# Patient Record
Sex: Male | Born: 1955 | Race: White | Hispanic: No | Smoking: Never smoker
Health system: Southern US, Community
[De-identification: ages and names within clinical notes are randomized; demographics above are authoritative.]

## PROBLEM LIST (undated history)

## (undated) DIAGNOSIS — K3531 Acute appendicitis with localized peritonitis and gangrene, without perforation: Secondary | ICD-10-CM

## (undated) DIAGNOSIS — I10 Essential (primary) hypertension: Secondary | ICD-10-CM

## (undated) HISTORY — DX: Acute appendicitis with localized peritonitis and gangrene, without perforation: K35.31

---

## 2004-03-12 ENCOUNTER — Emergency Department: Payer: Self-pay | Admitting: Internal Medicine

## 2004-04-02 ENCOUNTER — Other Ambulatory Visit: Payer: Self-pay

## 2004-04-02 ENCOUNTER — Emergency Department: Payer: Self-pay | Admitting: Unknown Physician Specialty

## 2007-12-07 ENCOUNTER — Emergency Department: Payer: Self-pay | Admitting: Emergency Medicine

## 2013-04-01 ENCOUNTER — Ambulatory Visit: Payer: Self-pay | Admitting: Internal Medicine

## 2013-04-12 DIAGNOSIS — I10 Essential (primary) hypertension: Secondary | ICD-10-CM | POA: Insufficient documentation

## 2013-09-19 DIAGNOSIS — M109 Gout, unspecified: Secondary | ICD-10-CM | POA: Insufficient documentation

## 2015-06-05 DIAGNOSIS — N529 Male erectile dysfunction, unspecified: Secondary | ICD-10-CM | POA: Insufficient documentation

## 2016-12-23 DIAGNOSIS — J4521 Mild intermittent asthma with (acute) exacerbation: Secondary | ICD-10-CM | POA: Insufficient documentation

## 2017-06-24 ENCOUNTER — Emergency Department: Payer: BC Managed Care – PPO | Admitting: Certified Registered Nurse Anesthetist

## 2017-06-24 ENCOUNTER — Observation Stay
Admission: EM | Admit: 2017-06-24 | Discharge: 2017-06-26 | Disposition: A | Discharge status: Hospice | Payer: BC Managed Care – PPO | Attending: Surgery | Admitting: Surgery

## 2017-06-24 ENCOUNTER — Encounter: Payer: Self-pay | Admitting: Emergency Medicine

## 2017-06-24 ENCOUNTER — Encounter
Admission: EM | Disposition: A | Discharge status: Hospice | Payer: Self-pay | Source: Home / Self Care | Attending: Emergency Medicine

## 2017-06-24 ENCOUNTER — Other Ambulatory Visit: Payer: Self-pay

## 2017-06-24 ENCOUNTER — Emergency Department: Payer: BC Managed Care – PPO

## 2017-06-24 DIAGNOSIS — Z79899 Other long term (current) drug therapy: Secondary | ICD-10-CM | POA: Diagnosis not present

## 2017-06-24 DIAGNOSIS — K3531 Acute appendicitis with localized peritonitis and gangrene, without perforation: Principal | ICD-10-CM | POA: Insufficient documentation

## 2017-06-24 DIAGNOSIS — K3532 Acute appendicitis with perforation and localized peritonitis, without abscess: Secondary | ICD-10-CM | POA: Diagnosis not present

## 2017-06-24 DIAGNOSIS — I10 Essential (primary) hypertension: Secondary | ICD-10-CM | POA: Diagnosis not present

## 2017-06-24 DIAGNOSIS — K3580 Unspecified acute appendicitis: Secondary | ICD-10-CM | POA: Diagnosis present

## 2017-06-24 DIAGNOSIS — K219 Gastro-esophageal reflux disease without esophagitis: Secondary | ICD-10-CM | POA: Diagnosis not present

## 2017-06-24 DIAGNOSIS — A419 Sepsis, unspecified organism: Secondary | ICD-10-CM

## 2017-06-24 HISTORY — DX: Essential (primary) hypertension: I10

## 2017-06-24 HISTORY — DX: Acute appendicitis with localized peritonitis and gangrene, without perforation: K35.31

## 2017-06-24 HISTORY — PX: LAPAROSCOPIC APPENDECTOMY: SHX408

## 2017-06-24 LAB — URINALYSIS, COMPLETE (UACMP) WITH MICROSCOPIC
BACTERIA UA: NONE SEEN
BILIRUBIN URINE: NEGATIVE
GLUCOSE, UA: NEGATIVE mg/dL
HGB URINE DIPSTICK: NEGATIVE
KETONES UR: 5 mg/dL — AB
LEUKOCYTES UA: NEGATIVE
NITRITE: NEGATIVE
PROTEIN: NEGATIVE mg/dL
Specific Gravity, Urine: 1.024 (ref 1.005–1.030)
pH: 5 (ref 5.0–8.0)

## 2017-06-24 LAB — CBC
HEMATOCRIT: 44.6 % (ref 40.0–52.0)
HEMOGLOBIN: 15 g/dL (ref 13.0–18.0)
MCH: 28.1 pg (ref 26.0–34.0)
MCHC: 33.7 g/dL (ref 32.0–36.0)
MCV: 83.4 fL (ref 80.0–100.0)
Platelets: 302 10*3/uL (ref 150–440)
RBC: 5.34 MIL/uL (ref 4.40–5.90)
RDW: 14.7 % — AB (ref 11.5–14.5)
WBC: 25.6 10*3/uL — AB (ref 3.8–10.6)

## 2017-06-24 LAB — COMPREHENSIVE METABOLIC PANEL
ALBUMIN: 4.5 g/dL (ref 3.5–5.0)
ALT: 20 U/L (ref 17–63)
ANION GAP: 9 (ref 5–15)
AST: 25 U/L (ref 15–41)
Alkaline Phosphatase: 83 U/L (ref 38–126)
BUN: 10 mg/dL (ref 6–20)
CHLORIDE: 101 mmol/L (ref 101–111)
CO2: 23 mmol/L (ref 22–32)
Calcium: 9.9 mg/dL (ref 8.9–10.3)
Creatinine, Ser: 0.88 mg/dL (ref 0.61–1.24)
GFR calc Af Amer: >60 mL/min (ref >60)
GFR calc non Af Amer: >60 mL/min (ref >60)
GLUCOSE: 161 mg/dL — AB (ref 65–99)
POTASSIUM: 4.4 mmol/L (ref 3.5–5.1)
Sodium: 133 mmol/L — ABNORMAL LOW (ref 135–145)
TOTAL PROTEIN: 8.6 g/dL — AB (ref 6.5–8.1)
Total Bilirubin: 2 mg/dL — ABNORMAL HIGH (ref 0.3–1.2)

## 2017-06-24 LAB — LACTIC ACID, PLASMA
LACTIC ACID, VENOUS: 1.4 mmol/L (ref 0.5–1.9)
Lactic Acid, Venous: 1.3 mmol/L (ref 0.5–1.9)

## 2017-06-24 LAB — LIPASE, BLOOD: LIPASE: 24 U/L (ref 11–51)

## 2017-06-24 SURGERY — APPENDECTOMY, LAPAROSCOPIC
Anesthesia: General | Site: Abdomen | Wound class: Clean Contaminated

## 2017-06-24 MED ORDER — ENOXAPARIN SODIUM 40 MG/0.4ML ~~LOC~~ SOLN
40.0000 mg | SUBCUTANEOUS | Status: DC
  Administered 2017-06-25: 40 mg via SUBCUTANEOUS
  Filled 2017-06-24 (×2): qty 0.4

## 2017-06-24 MED ORDER — ESMOLOL HCL 100 MG/10ML IV SOLN
INTRAVENOUS | Status: DC | PRN
  Administered 2017-06-24: 20 mg via INTRAVENOUS

## 2017-06-24 MED ORDER — ROCURONIUM BROMIDE 100 MG/10ML IV SOLN
INTRAVENOUS | Status: DC | PRN
  Administered 2017-06-24: 10 mg via INTRAVENOUS
  Administered 2017-06-24: 5 mg via INTRAVENOUS
  Administered 2017-06-24: 40 mg via INTRAVENOUS

## 2017-06-24 MED ORDER — BUDESONIDE 0.25 MG/2ML IN SUSP
0.2500 mg | Freq: Two times a day (BID) | RESPIRATORY_TRACT | Status: DC
  Administered 2017-06-24 – 2017-06-26 (×4): 0.25 mg via RESPIRATORY_TRACT
  Filled 2017-06-24 (×4): qty 2

## 2017-06-24 MED ORDER — ONDANSETRON HCL 4 MG/2ML IJ SOLN: 4.0000 mg | Freq: Once | INTRAMUSCULAR | Status: DC | PRN

## 2017-06-24 MED ORDER — ONDANSETRON HCL 4 MG/2ML IJ SOLN
4.0000 mg | Freq: Once | INTRAMUSCULAR | Status: DC
  Filled 2017-06-24: qty 2

## 2017-06-24 MED ORDER — CHLORHEXIDINE GLUCONATE CLOTH 2 % EX PADS: 6.0000 | MEDICATED_PAD | Freq: Once | CUTANEOUS | Status: DC

## 2017-06-24 MED ORDER — PIPERACILLIN-TAZOBACTAM 3.375 G IVPB 30 MIN
3.3750 g | Freq: Once | INTRAVENOUS | Status: AC
  Administered 2017-06-24: 3.375 g via INTRAVENOUS
  Filled 2017-06-24: qty 50

## 2017-06-24 MED ORDER — SUGAMMADEX SODIUM 200 MG/2ML IV SOLN
INTRAVENOUS | Status: DC | PRN
  Administered 2017-06-24: 200 mg via INTRAVENOUS

## 2017-06-24 MED ORDER — ACETAMINOPHEN 10 MG/ML IV SOLN
INTRAVENOUS | Status: AC
Start: 2017-06-24 — End: ?
  Filled 2017-06-24: qty 100

## 2017-06-24 MED ORDER — FENTANYL CITRATE (PF) 100 MCG/2ML IJ SOLN
INTRAMUSCULAR | Status: AC
  Filled 2017-06-24: qty 2

## 2017-06-24 MED ORDER — IOPAMIDOL (ISOVUE-370) INJECTION 76%
75.0000 mL | Freq: Once | INTRAVENOUS | Status: AC | PRN
  Administered 2017-06-24: 75 mL via INTRAVENOUS

## 2017-06-24 MED ORDER — FENTANYL CITRATE (PF) 100 MCG/2ML IJ SOLN
INTRAMUSCULAR | Status: DC | PRN
  Administered 2017-06-24 (×3): 50 ug via INTRAVENOUS

## 2017-06-24 MED ORDER — FENTANYL CITRATE (PF) 100 MCG/2ML IJ SOLN
INTRAMUSCULAR | Status: AC
  Administered 2017-06-24: 25 ug via INTRAVENOUS
  Filled 2017-06-24: qty 2

## 2017-06-24 MED ORDER — ONDANSETRON HCL 4 MG/2ML IJ SOLN
INTRAMUSCULAR | Status: DC | PRN
Start: 2017-06-24 — End: 2017-06-24
  Administered 2017-06-24: 4 mg via INTRAVENOUS

## 2017-06-24 MED ORDER — MORPHINE SULFATE (PF) 2 MG/ML IV SOLN
2.0000 mg | INTRAVENOUS | Status: DC | PRN
  Administered 2017-06-24: 2 mg via INTRAVENOUS
  Filled 2017-06-24: qty 1

## 2017-06-24 MED ORDER — SUCCINYLCHOLINE CHLORIDE 20 MG/ML IJ SOLN
INTRAMUSCULAR | Status: DC | PRN
Start: 2017-06-24 — End: 2017-06-24
  Administered 2017-06-24: 100 mg via INTRAVENOUS

## 2017-06-24 MED ORDER — IPRATROPIUM-ALBUTEROL 0.5-2.5 (3) MG/3ML IN SOLN
3.0000 mL | Freq: Four times a day (QID) | RESPIRATORY_TRACT | Status: DC | PRN
  Administered 2017-06-26: 3 mL via RESPIRATORY_TRACT
  Filled 2017-06-24: qty 3

## 2017-06-24 MED ORDER — ACETAMINOPHEN 10 MG/ML IV SOLN
INTRAVENOUS | Status: DC | PRN
Start: 2017-06-24 — End: 2017-06-24
  Administered 2017-06-24: 1000 mg via INTRAVENOUS

## 2017-06-24 MED ORDER — ACETAMINOPHEN 650 MG RE SUPP: 650.0000 mg | Freq: Four times a day (QID) | RECTAL | Status: DC | PRN

## 2017-06-24 MED ORDER — SUGAMMADEX SODIUM 200 MG/2ML IV SOLN
INTRAVENOUS | Status: AC
  Filled 2017-06-24: qty 2

## 2017-06-24 MED ORDER — DEXAMETHASONE SODIUM PHOSPHATE 10 MG/ML IJ SOLN
INTRAMUSCULAR | Status: AC
Start: 2017-06-24 — End: 2017-06-24
  Filled 2017-06-24: qty 1

## 2017-06-24 MED ORDER — SODIUM CHLORIDE 0.9 % IV BOLUS
1000.0000 mL | Freq: Once | INTRAVENOUS | Status: AC
  Administered 2017-06-24: 1000 mL via INTRAVENOUS

## 2017-06-24 MED ORDER — PIPERACILLIN-TAZOBACTAM 3.375 G IVPB
3.3750 g | Freq: Three times a day (TID) | INTRAVENOUS | Status: DC
  Administered 2017-06-24 – 2017-06-26 (×5): 3.375 g via INTRAVENOUS
  Filled 2017-06-24 (×5): qty 50

## 2017-06-24 MED ORDER — LACTATED RINGERS IV SOLN
INTRAVENOUS | Status: DC | PRN
  Administered 2017-06-24: 18:00:00 via INTRAVENOUS

## 2017-06-24 MED ORDER — LIDOCAINE HCL (CARDIAC) PF 100 MG/5ML IV SOSY
PREFILLED_SYRINGE | INTRAVENOUS | Status: DC | PRN
  Administered 2017-06-24: 100 mg via INTRAVENOUS

## 2017-06-24 MED ORDER — ONDANSETRON HCL 4 MG/2ML IJ SOLN
INTRAMUSCULAR | Status: AC
  Filled 2017-06-24: qty 2

## 2017-06-24 MED ORDER — SUCCINYLCHOLINE CHLORIDE 20 MG/ML IJ SOLN
INTRAMUSCULAR | Status: AC
  Filled 2017-06-24: qty 1

## 2017-06-24 MED ORDER — LIDOCAINE HCL 1 % IJ SOLN
INTRAMUSCULAR | Status: DC | PRN
  Administered 2017-06-24: 20 mL via SUBCUTANEOUS

## 2017-06-24 MED ORDER — ACETAMINOPHEN 325 MG PO TABS: 650.0000 mg | ORAL_TABLET | Freq: Four times a day (QID) | ORAL | Status: DC | PRN

## 2017-06-24 MED ORDER — GABAPENTIN 300 MG PO CAPS: 300.0000 mg | ORAL_CAPSULE | ORAL | Status: DC

## 2017-06-24 MED ORDER — IOPAMIDOL (ISOVUE-300) INJECTION 61%
30.0000 mL | Freq: Once | INTRAVENOUS | Status: AC | PRN
  Administered 2017-06-24: 30 mL via ORAL

## 2017-06-24 MED ORDER — LISINOPRIL 20 MG PO TABS
20.0000 mg | ORAL_TABLET | Freq: Every day | ORAL | Status: DC
  Administered 2017-06-25 – 2017-06-26 (×2): 20 mg via ORAL
  Filled 2017-06-24 (×2): qty 1

## 2017-06-24 MED ORDER — ONDANSETRON HCL 4 MG/2ML IJ SOLN: 4.0000 mg | Freq: Four times a day (QID) | INTRAMUSCULAR | Status: DC | PRN

## 2017-06-24 MED ORDER — FENTANYL CITRATE (PF) 100 MCG/2ML IJ SOLN
25.0000 ug | INTRAMUSCULAR | Status: DC | PRN
  Administered 2017-06-24 (×4): 25 ug via INTRAVENOUS

## 2017-06-24 MED ORDER — KETOROLAC TROMETHAMINE 30 MG/ML IJ SOLN
30.0000 mg | Freq: Four times a day (QID) | INTRAMUSCULAR | Status: DC
  Administered 2017-06-24 – 2017-06-26 (×7): 30 mg via INTRAVENOUS
  Filled 2017-06-24 (×7): qty 1

## 2017-06-24 MED ORDER — ROCURONIUM BROMIDE 50 MG/5ML IV SOLN
INTRAVENOUS | Status: AC
Start: 2017-06-24 — End: 2017-06-24
  Filled 2017-06-24: qty 1

## 2017-06-24 MED ORDER — PROPOFOL 10 MG/ML IV BOLUS
INTRAVENOUS | Status: DC | PRN
  Administered 2017-06-24: 160 mg via INTRAVENOUS

## 2017-06-24 MED ORDER — PIPERACILLIN-TAZOBACTAM 3.375 G IVPB: 3.3750 g | Freq: Three times a day (TID) | INTRAVENOUS | Status: DC

## 2017-06-24 MED ORDER — LIDOCAINE HCL (PF) 2 % IJ SOLN
INTRAMUSCULAR | Status: AC
  Filled 2017-06-24: qty 10

## 2017-06-24 MED ORDER — OXYCODONE-ACETAMINOPHEN 5-325 MG PO TABS: 1.0000 | ORAL_TABLET | ORAL | Status: DC | PRN

## 2017-06-24 MED ORDER — DEXAMETHASONE SODIUM PHOSPHATE 10 MG/ML IJ SOLN
INTRAMUSCULAR | Status: DC | PRN
  Administered 2017-06-24: 10 mg via INTRAVENOUS

## 2017-06-24 MED ORDER — AMLODIPINE BESYLATE 5 MG PO TABS
5.0000 mg | ORAL_TABLET | Freq: Every day | ORAL | Status: DC
  Administered 2017-06-25 – 2017-06-26 (×2): 5 mg via ORAL
  Filled 2017-06-24 (×2): qty 1

## 2017-06-24 MED ORDER — PROPOFOL 10 MG/ML IV BOLUS
INTRAVENOUS | Status: AC
  Filled 2017-06-24: qty 40

## 2017-06-24 MED ORDER — MORPHINE SULFATE (PF) 4 MG/ML IV SOLN
4.0000 mg | Freq: Once | INTRAVENOUS | Status: DC
  Filled 2017-06-24: qty 1

## 2017-06-24 MED ORDER — ONDANSETRON 4 MG PO TBDP: 4.0000 mg | ORAL_TABLET | Freq: Four times a day (QID) | ORAL | Status: DC | PRN

## 2017-06-24 MED ORDER — KCL IN DEXTROSE-NACL 20-5-0.45 MEQ/L-%-% IV SOLN
INTRAVENOUS | Status: DC
  Administered 2017-06-24 – 2017-06-25 (×2): via INTRAVENOUS
  Filled 2017-06-24 (×4): qty 1000

## 2017-06-24 MED ORDER — ALBUTEROL SULFATE (2.5 MG/3ML) 0.083% IN NEBU
2.5000 mg | INHALATION_SOLUTION | RESPIRATORY_TRACT | Status: DC | PRN
  Administered 2017-06-24 – 2017-06-25 (×2): 2.5 mg via RESPIRATORY_TRACT
  Filled 2017-06-24 (×2): qty 3

## 2017-06-24 SURGICAL SUPPLY — 38 items
BLADE SURG SZ11 CARB STEEL (BLADE) ×3 IMPLANT
CANISTER SUCT 3000ML PPV (MISCELLANEOUS) ×3 IMPLANT
CHLORAPREP W/TINT 26ML (MISCELLANEOUS) ×3 IMPLANT
CUTTER FLEX LINEAR 45M (STAPLE) ×3 IMPLANT
DECANTER SPIKE VIAL GLASS SM (MISCELLANEOUS) ×3 IMPLANT
DERMABOND ADVANCED (GAUZE/BANDAGES/DRESSINGS) ×2
DERMABOND ADVANCED .7 DNX12 (GAUZE/BANDAGES/DRESSINGS) ×1 IMPLANT
ELECT REM PT RETURN 9FT ADLT (ELECTROSURGICAL) ×3
ELECTRODE REM PT RTRN 9FT ADLT (ELECTROSURGICAL) ×1 IMPLANT
GLOVE BIO SURGEON STRL SZ7 (GLOVE) ×3 IMPLANT
GLOVE BIOGEL PI IND STRL 7.5 (GLOVE) ×1 IMPLANT
GLOVE BIOGEL PI INDICATOR 7.5 (GLOVE) ×2
GOWN STRL REUS W/ TWL LRG LVL4 (GOWN DISPOSABLE) ×1 IMPLANT
GOWN STRL REUS W/ TWL XL LVL3 (GOWN DISPOSABLE) ×1 IMPLANT
GOWN STRL REUS W/TWL LRG LVL4 (GOWN DISPOSABLE) ×2
GOWN STRL REUS W/TWL XL LVL3 (GOWN DISPOSABLE) ×2
GRASPER SUT TROCAR 14GX15 (MISCELLANEOUS) ×3 IMPLANT
IRRIGATION STRYKERFLOW (MISCELLANEOUS) IMPLANT
IRRIGATOR STRYKERFLOW (MISCELLANEOUS)
KIT TURNOVER KIT A (KITS) ×3 IMPLANT
NEEDLE HYPO 22GX1.5 SAFETY (NEEDLE) ×3 IMPLANT
NEEDLE INSUFFLATION 14GA 120MM (NEEDLE) ×3 IMPLANT
NS IRRIG 1000ML POUR BTL (IV SOLUTION) ×3 IMPLANT
PACK LAP CHOLECYSTECTOMY (MISCELLANEOUS) ×3 IMPLANT
POUCH SPECIMEN RETRIEVAL 10MM (ENDOMECHANICALS) ×3 IMPLANT
RELOAD 45 VASCULAR/THIN (ENDOMECHANICALS) IMPLANT
RELOAD STAPLE TA45 3.5 REG BLU (ENDOMECHANICALS) ×3 IMPLANT
SHEARS HARMONIC ACE PLUS 36CM (ENDOMECHANICALS) ×3 IMPLANT
SLEEVE ENDOPATH XCEL 5M (ENDOMECHANICALS) ×3 IMPLANT
SOL .9 NS 3000ML IRR  AL (IV SOLUTION)
SOL .9 NS 3000ML IRR UROMATIC (IV SOLUTION) IMPLANT
SUT MNCRL AB 4-0 PS2 18 (SUTURE) ×3 IMPLANT
SUT VICRYL 0 UR6 27IN ABS (SUTURE) ×3 IMPLANT
SUT VICRYL AB 3-0 FS1 BRD 27IN (SUTURE) ×3 IMPLANT
TRAY FOLEY W/METER SILVER 16FR (SET/KITS/TRAYS/PACK) ×3 IMPLANT
TROCAR XCEL 12X100 BLDLESS (ENDOMECHANICALS) ×3 IMPLANT
TROCAR XCEL NON-BLD 5MMX100MML (ENDOMECHANICALS) ×3 IMPLANT
TUBING INSUFFLATION (TUBING) ×3 IMPLANT

## 2017-06-24 NOTE — ED Triage Notes (Signed)
Pt comes into the ED via POV c/o abdominal pain.  Denies any N/V/D.  Patient states he took two zantac today and it eased the pain a little, but the pain is still there.  The pain is offset by foods.  Patient in NAd at this time with even and unlabored respirations. Denies any chest pain, blood in stools, shortness of breath, or dizziness.

## 2017-06-24 NOTE — ED Provider Notes (Signed)
[facility] Emergency Department Provider Note ____________________________________________   First MD Initiated Contact with Patient 06/24/17 1537     (approximate)  I have reviewed the triage vital signs and the nursing notes.   HISTORY  Chief Complaint Abdominal Pain   HPI Albert Simpson is a 62 y.o. male with previous history of hypertension and "stomach sensitivities" which she reports last occurred about 15 years ago.  Patient reports that this morning when he got about 6 AM experiencing pain in his mid abdomen that seems to feel like a 5 out of 10 hard to describe pain in his lower abdomen.  Is been bothersome.  He took 2 acid reflux pills and 2 ibuprofen without relief.  Currently reports 5 out of 10 pain.  Denies nausea or vomiting.  Currently does not wish for any pain medication, drove himself here.  Denies fevers and chills.  No pain or burning with urination.  No chest pain or trouble breathing.  2-3 small bowel movements today.  No black or bloody stool.    Past Medical History:  Diagnosis Date  . Hypertension     There are no active problems to display for this patient.   History reviewed. No pertinent surgical history.  Prior to Admission medications   Not on File  No previous allergies.  Reports history of hypertension  Allergies Patient has no known allergies.  No family history on file.  Social History Social History   Tobacco Use  . Smoking status: Never Smoker  . Smokeless tobacco: Never Used  Substance Use Topics  . Alcohol use: Yes  . Drug use: Never    Review of Systems Constitutional: No fever/chills Eyes: No visual changes. ENT: No sore throat. Cardiovascular: Denies chest pain. Respiratory: Denies shortness of breath. Gastrointestinal: No nausea, no vomiting.  No diarrhea.  No constipation. Genitourinary: Negative for dysuria. Musculoskeletal: Negative for back pain. Skin: Negative for  rash. Neurological: Negative for headaches, focal weakness or numbness.    ____________________________________________   PHYSICAL EXAM:  VITAL SIGNS: ED Triage Vitals  Enc Vitals Group     BP 06/24/17 1329 125/83     Pulse Rate 06/24/17 1329 (!) 126     Resp 06/24/17 1329 18     Temp 06/24/17 1329 99.8 F (37.7 C)     Temp Source 06/24/17 1329 Oral     SpO2 06/24/17 1329 93 %     Weight 06/24/17 1329 160 lb (72.6 kg)     Height 06/24/17 1329 5\' 4"  (1.626 m)     Head Circumference --      Peak Flow --      Pain Score 06/24/17 1337 4     Pain Loc --      Pain Edu? --      Excl. in GC? --     Constitutional: Alert and oriented. Well appearing and in no acute distress. Eyes: Conjunctivae are normal. Head: Atraumatic. Nose: No congestion/rhinnorhea. Mouth/Throat: Mucous membranes are moist. Neck: No stridor.   Cardiovascular: Normal rate, regular rhythm. Grossly normal heart sounds.  Good peripheral circulation. Respiratory: Normal respiratory effort.  No retractions. Lungs CTAB. Gastrointestinal: Soft and mild tenderness throughout both lower quadrants, this was increased over the left lower quadrant and left flank compared to the right.  No peritonitis.  No rebound or guarding.  Negative for Murphy sign. Musculoskeletal: No lower extremity tenderness nor edema. Neurologic:  Normal speech and language. No gross focal neurologic deficits are appreciated.  Skin:  Skin is warm, dry and intact. No rash noted. Psychiatric: Mood and affect are normal. Speech and behavior are normal.  ____________________________________________   LABS (all labs ordered are listed, but only abnormal results are displayed)  Labs Reviewed  COMPREHENSIVE METABOLIC PANEL - Abnormal; Notable for the following components:      Result Value   Sodium 133 (*)    Glucose, Bld 161 (*)    Total Protein 8.6 (*)    Total Bilirubin 2.0 (*)    All other components within normal limits  CBC - Abnormal;  Notable for the following components:   WBC 25.6 (*)    RDW 14.7 (*)    All other components within normal limits  URINALYSIS, COMPLETE (UACMP) WITH MICROSCOPIC - Abnormal; Notable for the following components:   Color, Urine YELLOW (*)    APPearance CLEAR (*)    Ketones, ur 5 (*)    All other components within normal limits  CULTURE, BLOOD (ROUTINE X 2)  CULTURE, BLOOD (ROUTINE X 2)  LIPASE, BLOOD  LACTIC ACID, PLASMA  LACTIC ACID, PLASMA   ____________________________________________  EKG   ____________________________________________  RADIOLOGY  CT scan reviewed, notable for acute appendicitis per radiologist    ____________________________________________   PROCEDURES  Procedure(s) performed: None  Procedures  Critical Care performed: No  ____________________________________________   INITIAL IMPRESSION / ASSESSMENT AND PLAN / ED COURSE  Pertinent labs & imaging results that were available during my care of the patient were reviewed by me and considered in my medical decision making (see chart for details).  Differential diagnosis includes but is not limited to, abdominal perforation, aortic dissection, cholecystitis, appendicitis, diverticulitis, colitis, esophagitis/gastritis, kidney stone, pyelonephritis, urinary tract infection, aortic aneurysm. All are considered in decision and treatment plan. Based upon the patient's presentation and risk factors, will proceed with CT scan of the abdomen pelvis to evaluate for etiology of abdominal pain especially given the associated elevated white count.  ----------------------------------------- 5:05 PM on 06/24/2017 -----------------------------------------  CT scan reviewed, concerning for acute appendicitis.  Discussed with Dr. [name] of general surgery.  This point given the patient's leukocytosis and persistent tachycardia we will cover with Zosyn in the event of potential bacteremia or sepsis.  In addition  patient understanding of need for surgery consult, anticipate admission to general surgery service.  Patient awake and alert, does request he would like some pain medication now given he will not be discharged.       ____________________________________________   FINAL CLINICAL IMPRESSION(S) / ED DIAGNOSES  Final diagnoses:  Acute appendicitis, unspecified acute appendicitis type  Sepsis, due to unspecified organism Navos)      NEW MEDICATIONS STARTED DURING THIS VISIT:  New Prescriptions   No medications on file     Note:  This document was prepared using Dragon voice recognition software and may include unintentional dictation errors.     Sharyn Creamer, MD 06/24/17 [phone]

## 2017-06-24 NOTE — Op Note (Signed)
SURGICAL OPERATIVE REPORT  DATE OF PROCEDURE: 06/24/2017  ATTENDING Surgeon(s): Ancil Linseyavis, Jason Evan, MD  ANESTHESIA: GETA (General)  PRE-OPERATIVE DIAGNOSIS: Acute gangrenous non-perforated appendicitis with localized peritonitis (K35.31)  POST-OPERATIVE DIAGNOSIS: Acute gangrenous non-perforated appendicitis with localized peritonitis (K35.31)  PROCEDURE(S):  1.) Laparoscopic appendectomy (cpt: 44970)  INTRAOPERATIVE FINDINGS: Severely inflamed, exceptionally dilated, and gangrenous non-perforated appendix not surrounded by any ascites  INTRAVENOUS FLUIDS: 700 mL crystalloid   ESTIMATED BLOOD LOSS: Minimal (<20 mL)  SPECIMENS: Appendix  IMPLANTS: None  DRAINS: None  COMPLICATIONS: None apparent  CONDITION AT END OF PROCEDURE: Hemodynamically stable and extubated  DISPOSITION OF PATIENT: PACU  INDICATIONS FOR PROCEDURE:  Patient is a 62 y.o. male presented with acute onset of epigastric abdominal pain that progressed to become greatest at patient's lower abdomen. Patient denied any nausea, vomiting, change in bowel habits, fever/chills, CP, or SOB and reported the pain was controlled in the Emergency Department. All risks, benefits, and alternatives to appendectomy were discussed with the patient, all of patient's questions were answered to his expressed satisfaction, and informed consent was obtained and documented.  DETAILS OF PROCEDURE: Patient was brought to the operating suite and appropriately identified. General anesthesia was administered along with confirmation of appropriate pre-operative antibiotics, and endotracheal intubation was performed by anesthetist, along with NG/OG tube for gastric decompression. In supine position, operative site was prepped and draped in usual sterile fashion, and following a brief time out, initial 5 mm incision was made in a natural skin crease just below the umbilicus. Fascia was then elevated, and a Verress needle was inserted and its  proper position confirmed using saline meniscus test prior to abdominal insufflation.  Upon insufflation of the abdominal cavity with carbon dioxide to a well-tolerated pressure of 12-15 mmHg, a 5 mm peri-umbilical port followed by laparoscope were inserted and used to inspect the abdominal cavity and its contents with no injuries from insertion of the first trochar noted. Two additional trocars were inserted, a 12 mm port at the Left lower quadrant position and another 5 mm port at the suprapubic position. The table was then placed in Trendelenburg position with the Right side up, and blunt graspers were gently used to retract the bowel overlying a severely inflamed, rigid, dilated, and gangrenous appendix with focally severe surrounding peri-appendiceal inflammation without any appreciable ascites. The appendix was gently retracted at its middle (essentially resting on the open blunt graspers to avoid perforating the appendix), and the base of the appendix and mesoappendix were identified in relation to the cecum. The mesoappendix was dissected from the visceral appendix, and hemostasis was achieved using a harmonic scalpel. Upon freeing the visceral appendix from the mesoappendix, an endostapler loaded with a standard blue tissue load was advanced across the comparatively normal-appearing base of the visceral appendix, which was compressed for several seconds, and the stapler was deployed and removed from the abdominal cavity. Hemostasis was confirmed, and the specimen was extracted from the abdominal cavity in a laparoscopic specimen bag.  The intraperitoneal cavity was inspected with no additional findings. PMI laparoscopic fascial closure device was then used to re-approximate fascia at the 12 mm Left lower quadrant port site. All ports were then removed under direct visualization, and the abdominal cavity was desuflated. All port sites were irrigated/cleaned, additional local anesthetic was injected at  each incision, 3-0 Vicryl was used to re-approximate dermis at 12 mm port site(s), and subcuticular 4-0 Monocryl suture staples was used to re-approximate skin. Skin was then cleaned, dried, and sterile skin glue  was applied. Patient was then safely able to be awakened, extubated, and transferred to PACU for post-operative monitoring and care.   I was present for all aspects of the above procedure, and no operative complications were apparent.

## 2017-06-24 NOTE — Consult Note (Signed)
SURGICAL CONSULTATION NOTE (initial) - cpt: [id number]  HISTORY OF PRESENT ILLNESS (HPI):  62 y.o. male presented to [facility] ED today for evaluation of abdominal pain. Patient reports he felt well when he went to sleep last night, but this morning began to develop generalized vague abdominal pain that has since become focused at his lower abdomen. He otherwise denies any fever/chills, diarrhea, N/G, CP, or SOB. Though he is 62 years old and sees an NP every 6 months, he has not had a colonoscopy. He denies any history of constipation, blood per rectum, or recent unintentional weight loss/gain.  Surgery is consulted by ED physician Dr. Fanny Bien in this context for evaluation and management of acute non-perforated appendicitis.  PAST MEDICAL HISTORY (PMH):  Past Medical History:  Diagnosis Date  . Hypertension      PAST SURGICAL HISTORY (PSH):  History reviewed. No pertinent surgical history.   MEDICATIONS:  Prior to Admission medications   Medication Sig Start Date End Date Taking? Authorizing Provider  albuterol (PROVENTIL HFA;VENTOLIN HFA) 108 (90 Base) MCG/ACT inhaler Inhale 2 puffs into the lungs every 4 (four) hours as needed for wheezing or shortness of breath.  05/31/17  Yes [provider]  amLODipine (NORVASC) 5 MG tablet Take 5 mg by mouth daily. 05/29/17  Yes [provider]  ASMANEX 120 METERED DOSES 220 MCG/INH inhaler Inhale 1 puff into the lungs daily. 06/03/17  Yes [provider]  COMBIVENT RESPIMAT 20-100 MCG/ACT AERS respimat Inhale 1 puff into the lungs 4 (four) times daily as needed for wheezing or shortness of breath.  05/30/17  Yes [provider]  lisinopril (PRINIVIL,ZESTRIL) 20 MG tablet Take 20 mg by mouth daily. 05/29/17  Yes [provider]     ALLERGIES:  No Known Allergies   SOCIAL HISTORY:  Social History   Socioeconomic History  . Marital status: Divorced    Spouse name: Not on file  . Number of children: Not on file  .  Years of education: Not on file  . Highest education level: Not on file  Occupational History  . Not on file  Social Needs  . Financial resource strain: Not on file  . Food insecurity:    Worry: Not on file    Inability: Not on file  . Transportation needs:    Medical: Not on file    Non-medical: Not on file  Tobacco Use  . Smoking status: Never Smoker  . Smokeless tobacco: Never Used  Substance and Sexual Activity  . Alcohol use: Yes  . Drug use: Never  . Sexual activity: Not on file  Lifestyle  . Physical activity:    Days per week: Not on file    Minutes per session: Not on file  . Stress: Not on file  Relationships  . Social connections:    Talks on phone: Not on file    Gets together: Not on file    Attends religious service: Not on file    Active member of club or organization: Not on file    Attends meetings of clubs or organizations: Not on file    Relationship status: Not on file  . Intimate partner violence:    Fear of current or ex partner: Not on file    Emotionally abused: Not on file    Physically abused: Not on file    Forced sexual activity: Not on file  Other Topics Concern  . Not on file  Social History Narrative  . Not on file  The patient currently resides (home / rehab facility / nursing home): Home The patient normally is (ambulatory / bedbound): Ambulatory   FAMILY HISTORY:  No family history on file.   REVIEW OF SYSTEMS:  Constitutional: denies weight loss, fever, chills, or sweats  Eyes: denies any other vision changes, history of eye injury  ENT: denies sore throat, hearing problems  Respiratory: denies shortness of breath, wheezing  Cardiovascular: denies chest pain, palpitations  Gastrointestinal: abdominal pain, N/V, and bowel function as per HPI Genitourinary: denies burning with urination or urinary frequency Musculoskeletal: denies any other joint pains or cramps  Skin: denies any other rashes or skin discolorations   Neurological: denies any other headache, dizziness, weakness  Psychiatric: denies any other depression, anxiety   All other review of systems were negative   VITAL SIGNS:  Temp:  [99.8 F (37.7 C)] 99.8 F (37.7 C) (04/26 1329) Pulse Rate:  [120-126] 120 (04/26 1600) Resp:  [17-18] 17 (04/26 1600) BP: (125-141)/(83-90) 141/90 (04/26 1600) SpO2:  [93 %-94 %] 94 % (04/26 1600) Weight:  [160 lb (72.6 kg)] 160 lb (72.6 kg) (04/26 1329)     Height: 5\' 4"  (162.6 cm) Weight: 160 lb (72.6 kg) BMI (Calculated): 27.45   INTAKE/OUTPUT:  This shift: Total I/O In: 1000 [IV Piggyback:1000] Out: -   Last 2 shifts: @IOLAST2SHIFTS @   PHYSICAL EXAM:  Constitutional:  -- Normal body habitus  -- Awake, alert, and oriented x3, no apparent distress Eyes:  -- Pupils equally round and reactive to light  -- No scleral icterus, B/L no occular discharge Ear, nose, throat: -- Neck is FROM WNL -- No jugular venous distension  Pulmonary:  -- No wheezes or rhales -- Equal breath sounds bilaterally -- Breathing non-labored at rest Cardiovascular:  -- S1, S2 present  -- No pericardial rubs  Gastrointestinal:  -- Abdomen soft and somewhat protuberant, though patient describes as baseline, moderate lower abdominal tenderness to palpation, no guarding or rebound tenderness -- No abdominal masses appreciated, pulsatile or otherwise  Musculoskeletal and Integumentary:  -- Wounds or skin discoloration: None appreciated -- Extremities: B/L UE and LE FROM, hands and feet warm, no edema  Neurologic:  -- Motor function: Intact and symmetric -- Sensation: Intact and symmetric Psychiatric:  -- Mood and affect WNL  Labs:  CBC Latest Ref Rng & Units 06/24/2017  WBC 3.8 - 10.6 K/uL 25.6(H)  Hemoglobin 13.0 - 18.0 g/dL 16.1  Hematocrit 09.6 - 52.0 % 44.6  Platelets 150 - 440 K/uL 302   CMP Latest Ref Rng & Units 06/24/2017  Glucose 65 - 99 mg/dL 045(W)  BUN 6 - 20 mg/dL 10  Creatinine 0.98 - 1.19 mg/dL  1.47  Sodium 829 - 562 mmol/L 133(L)  Potassium 3.5 - 5.1 mmol/L 4.4  Chloride 101 - 111 mmol/L 101  CO2 22 - 32 mmol/L 23  Calcium 8.9 - 10.3 mg/dL 9.9  Total Protein 6.5 - 8.1 g/dL 1.3(Y)  Total Bilirubin 0.3 - 1.2 mg/dL 2.0(H)  Alkaline Phos 38 - 126 U/L 83  AST 15 - 41 U/L 25  ALT 17 - 63 U/L 20   Imaging studies:  CT Abdomen and Pelvis with Contrast - personally reviewed and discussed with patient and ED physician 1. Acute appendicitis. Appendix is significantly dilated to 1.7 cm with prominent peri appendiceal inflammation, but no CT evidence of rupture. No para appendiceal abscess. 2. No other acute abnormality. 3. Two small lung base nodules, largest on the left, ground-glass, measuring 5 mm. No follow-up needed  if patient is low-risk (and has no known or suspected primary neoplasm). Non-contrast chest CT can be considered in 12 months if patient is high-risk.  Assessment/Plan: (ICD-10's: K35.30) 62 y.o. male with acute non-perforated appendicitis, complicated by significant leukocytosis and sinus tachycardia despite 1L NS fluid bolus and by pertinent comorbidities including HTN and asthma, no prior screening colonoscopy (12 years overdue, no family history of colon cancer).   - NPO, IV fluids  - pain control prn  - IV antibiotics (Zosyn)   - all risks, benefits, and alternatives to laparoscopic appendectomy were discussed with the patient, all of his questions were answered to his expressed satisfaction, patient expresses he wishes to proceed, and informed consent was obtained.  - will plan for laparoscopic appendectomy pending OR availability   - DVT prophylaxis  All of the above findings and recommendations were discussed with the patient and ED physician and RN, and all of patient's questions were answered to his expressed satisfaction.  Thank you for the opportunity to participate in this patient's care.   -- Scherrie GerlachJason E. Earlene Plateravis, MD, RPVI [facility] - Partnering for exceptional care. Office: [phone]

## 2017-06-24 NOTE — Anesthesia Preprocedure Evaluation (Signed)
Anesthesia Evaluation  Patient identified by MRN, date of birth, ID band Patient awake    Reviewed: Allergy & Precautions, NPO status , Patient's Chart, lab work & pertinent test results  History of Anesthesia Complications Negative for: history of anesthetic complications  Airway Mallampati: II       Dental  (+) Partial Lower, Partial Upper, Poor Dentition, Chipped, Missing   Pulmonary asthma , neg sleep apnea, neg COPD,           Cardiovascular hypertension, Pt. on medications (-) Past MI and (-) CHF (-) dysrhythmias (-) Valvular Problems/Murmurs     Neuro/Psych neg Seizures    GI/Hepatic Neg liver ROS, GERD  Poorly Controlled,  Endo/Other  neg diabetes  Renal/GU negative Renal ROS     Musculoskeletal   Abdominal   Peds  Hematology   Anesthesia Other Findings   Reproductive/Obstetrics                             Anesthesia Physical Anesthesia Plan  ASA: II and emergent  Anesthesia Plan: General   Post-op Pain Management:    Induction: Rapid sequence and Cricoid pressure planned  PONV Risk Score and Plan: 2 and Dexamethasone and Ondansetron  Airway Management Planned: Oral ETT  Additional Equipment:   Intra-op Plan:   Post-operative Plan:   Informed Consent: I have reviewed the patients History and Physical, chart, labs and discussed the procedure including the risks, benefits and alternatives for the proposed anesthesia with the patient or authorized representative who has indicated his/her understanding and acceptance.     Plan Discussed with:   Anesthesia Plan Comments:         Anesthesia Quick Evaluation

## 2017-06-24 NOTE — ED Notes (Addendum)
Pt transported to OR via orderly.  Zosyn dose sent with patient per OR request.  All belongings bagged and sent with pt at this time.

## 2017-06-24 NOTE — Progress Notes (Signed)
Pharmacy Antibiotic Note  Albert Simpson is a 62 y.o. male admitted on 06/24/2017 with IAI.  Pharmacy has been consulted for Zosyn dosing.  Plan: Zosyn 3.375g IV q8h (4 hour infusion).  Height: 5\' 4"  (162.6 cm) Weight: 160 lb (72.6 kg) IBW/kg (Calculated) : 59.2  Temp (24hrs), Avg:99.8 F (37.7 C), Min:99.8 F (37.7 C), Max:99.8 F (37.7 C)  Recent Labs  Lab 06/24/17 1338 06/24/17 1604  WBC 25.6*  --   CREATININE 0.88  --   LATICACIDVEN  --  1.4    Estimated Creatinine Clearance: 80.5 mL/min (by C-G formula based on SCr of 0.88 mg/dL).    No Known Allergies  Antimicrobials this admission: Zosyn 4/26 >>   Dose adjustments this admission:   Microbiology results: 4/26 BCx: sent  Thank you for allowing pharmacy to be a part of this patient's care.  Albert Simpson, Albert Simpson D 06/24/2017 5:23 PM

## 2017-06-24 NOTE — Transfer of Care (Signed)
Immediate Anesthesia Transfer of Care Note  Patient: Albert LiRobert L Simpson  Procedure(s) Performed: APPENDECTOMY LAPAROSCOPIC (N/A Abdomen)  Patient Location: PACU  Anesthesia Type:General  Level of Consciousness: sedated  Airway & Oxygen Therapy: Patient Spontanous Breathing and Patient connected to face mask oxygen  Post-op Assessment: Report given to RN and Post -op Vital signs reviewed and stable  Post vital signs: Reviewed and stable  Last Vitals:  Vitals Value Taken Time  BP 149/75 06/24/2017  7:34 PM  Temp 36.9 C 06/24/2017  7:34 PM  Pulse 120 06/24/2017  7:35 PM  Resp 29 06/24/2017  7:35 PM  SpO2 98 % 06/24/2017  7:35 PM  Vitals shown include unvalidated device data.  Last Pain:  Vitals:   06/24/17 1727  TempSrc:   PainSc: 4          Complications: No apparent anesthesia complications

## 2017-06-24 NOTE — ED Notes (Signed)
Patient transported to CT 

## 2017-06-24 NOTE — ED Notes (Signed)
General surgery to bedside  ?

## 2017-06-24 NOTE — H&P (Signed)
SURGICAL ADMISSION HISTORY & PHYSICAL  HISTORY OF PRESENT ILLNESS (HPI):  62 y.o. Simpson presented to [facility] ED today for evaluation of abdominal pain. Patient reports he felt well when he went to sleep last night, but this morning began to develop generalized vague abdominal pain that has since become focused at his lower abdomen. He otherwise denies any fever/chills, diarrhea, N/G, CP, or SOB. Though he is 62 years old and sees an NP every 6 months, he has not had a colonoscopy. He denies any history of constipation, blood per rectum, or recent unintentional weight loss/gain.  Surgery is consulted by ED physician Dr. Fanny Bien in this context for evaluation and management of acute non-perforated appendicitis.  PAST MEDICAL HISTORY (PMH):  Past Medical History:  Diagnosis Date  . Hypertension      PAST SURGICAL HISTORY (PSH):  History reviewed. No pertinent surgical history.   MEDICATIONS:  Prior to Admission medications   Medication Sig Start Date End Date Taking? Authorizing Provider  albuterol (PROVENTIL HFA;VENTOLIN HFA) 108 (90 Base) MCG/ACT inhaler Inhale 2 puffs into the lungs every 4 (four) hours as needed for wheezing or shortness of breath.  05/31/17  Yes [provider]  amLODipine (NORVASC) 5 MG tablet Take 5 mg by mouth daily. 05/29/17  Yes [provider]  ASMANEX 120 METERED DOSES 220 MCG/INH inhaler Inhale 1 puff into the lungs daily. 06/03/17  Yes [provider]  COMBIVENT RESPIMAT 20-100 MCG/ACT AERS respimat Inhale 1 puff into the lungs 4 (four) times daily as needed for wheezing or shortness of breath.  05/30/17  Yes [provider]  lisinopril (PRINIVIL,ZESTRIL) 20 MG tablet Take 20 mg by mouth daily. 05/29/17  Yes [provider]     ALLERGIES:  No Known Allergies   SOCIAL HISTORY:  Social History   Socioeconomic History  . Marital status: Divorced    Spouse name: Not on file  . Number of children: Not on file  . Years of  education: Not on file  . Highest education level: Not on file  Occupational History  . Not on file  Social Needs  . Financial resource strain: Not on file  . Food insecurity:    Worry: Not on file    Inability: Not on file  . Transportation needs:    Medical: Not on file    Non-medical: Not on file  Tobacco Use  . Smoking status: Never Smoker  . Smokeless tobacco: Never Used  Substance and Sexual Activity  . Alcohol use: Yes  . Drug use: Never  . Sexual activity: Not on file  Lifestyle  . Physical activity:    Days per week: Not on file    Minutes per session: Not on file  . Stress: Not on file  Relationships  . Social connections:    Talks on phone: Not on file    Gets together: Not on file    Attends religious service: Not on file    Active member of club or organization: Not on file    Attends meetings of clubs or organizations: Not on file    Relationship status: Not on file  . Intimate partner violence:    Fear of current or ex partner: Not on file    Emotionally abused: Not on file    Physically abused: Not on file    Forced sexual activity: Not on file  Other Topics Concern  . Not on file  Social History Narrative  . Not on file  The patient currently resides (home / rehab facility / nursing home): Home The patient normally is (ambulatory / bedbound): Ambulatory   FAMILY HISTORY:  No family history on file.   REVIEW OF SYSTEMS:  Constitutional: denies weight loss, fever, chills, or sweats  Eyes: denies any other vision changes, history of eye injury  ENT: denies sore throat, hearing problems  Respiratory: denies shortness of breath, wheezing  Cardiovascular: denies chest pain, palpitations  Gastrointestinal: abdominal pain, N/V, and bowel function as per HPI Genitourinary: denies burning with urination or urinary frequency Musculoskeletal: denies any other joint pains or cramps  Skin: denies any other rashes or skin discolorations  Neurological:  denies any other headache, dizziness, weakness  Psychiatric: denies any other depression, anxiety   All other review of systems were negative   VITAL SIGNS:  Temp:  [99.8 F (37.7 C)] 99.8 F (37.7 C) (04/26 1329) Pulse Rate:  [120-126] 120 (04/26 1600) Resp:  [17-18] 17 (04/26 1600) BP: (125-141)/(83-90) 141/90 (04/26 1600) SpO2:  [93 %-94 %] 94 % (04/26 1600) Weight:  [160 lb (72.6 kg)] 160 lb (72.6 kg) (04/26 1329)     Height: 5\' 4"  (162.6 cm) Weight: 160 lb (72.6 kg) BMI (Calculated): 27.45   INTAKE/OUTPUT:  This shift: Total I/O In: 1000 [IV Piggyback:1000] Out: -   Last 2 shifts: @IOLAST2SHIFTS @   PHYSICAL EXAM:  Constitutional:  -- Normal body habitus  -- Awake, alert, and oriented x3, no apparent distress Eyes:  -- Pupils equally round and reactive to light  -- No scleral icterus, B/L no occular discharge Ear, nose, throat: -- Neck is FROM WNL -- No jugular venous distension  Pulmonary:  -- No wheezes or rhales -- Equal breath sounds bilaterally -- Breathing non-labored at rest Cardiovascular:  -- S1, S2 present  -- No pericardial rubs  Gastrointestinal:  -- Abdomen soft and somewhat protuberant, though patient describes as baseline, moderate lower abdominal tenderness to palpation, no guarding or rebound tenderness -- No abdominal masses appreciated, pulsatile or otherwise  Musculoskeletal and Integumentary:  -- Wounds or skin discoloration: None appreciated -- Extremities: B/L UE and LE FROM, hands and feet warm, no edema  Neurologic:  -- Motor function: Intact and symmetric -- Sensation: Intact and symmetric Psychiatric:  -- Mood and affect WNL  Labs:  CBC Latest Ref Rng & Units 06/24/2017  WBC 3.8 - 10.6 K/uL 25.6(H)  Hemoglobin 13.0 - 18.0 g/dL 40.915.0  Hematocrit 81.140.0 - 52.0 % 44.6  Platelets 150 - 440 K/uL 302   CMP Latest Ref Rng & Units 06/24/2017  Glucose 65 - 99 mg/dL 914(N161(H)  BUN 6 - 20 mg/dL 10  Creatinine 8.290.61 - 5.621.24 mg/dL 1.300.88  Sodium 865135  - 784145 mmol/L 133(L)  Potassium 3.5 - 5.1 mmol/L 4.4  Chloride 101 - 111 mmol/L 101  CO2 22 - 32 mmol/L 23  Calcium 8.9 - 10.3 mg/dL 9.9  Total Protein 6.5 - 8.1 g/dL 6.9(G8.6(H)  Total Bilirubin 0.3 - 1.2 mg/dL 2.0(H)  Alkaline Phos 38 - 126 U/L 83  AST 15 - 41 U/L 25  ALT 17 - 63 U/L 20   Imaging studies:  CT Abdomen and Pelvis with Contrast - personally reviewed and discussed with patient and ED physician 1. Acute appendicitis. Appendix is significantly dilated to 1.7 cm with prominent peri appendiceal inflammation, but no CT evidence of rupture. No para appendiceal abscess. 2. No other acute abnormality. 3. Two small lung base nodules, largest on the left, ground-glass, measuring 5 mm. No follow-up needed  if patient is low-risk (and has no known or suspected primary neoplasm). Non-contrast chest CT can be considered in 12 months if patient is high-risk.  Assessment/Plan: (ICD-10's: K35.30) 62 y.o. Simpson with acute non-perforated appendicitis, complicated by significant leukocytosis and sinus tachycardia despite 1L NS fluid bolus and by pertinent comorbidities including HTN and asthma, no prior screening colonoscopy (12 years overdue, no family history of colon cancer).   - NPO, IV fluids  - pain control prn  - IV antibiotics (Zosyn)   - all risks, benefits, and alternatives to laparoscopic appendectomy were discussed with the patient, all of his questions were answered to his expressed satisfaction, patient expresses he wishes to proceed, and informed consent was obtained.  - will plan for laparoscopic appendectomy pending OR availability   - DVT prophylaxis  All of the above findings and recommendations were discussed with the patient and ED physician and RN, and all of patient's questions were answered to his expressed satisfaction.  -- Scherrie Gerlach [name], MD, RPVI [place]: [facility] General Surgery - Partnering for exceptional care. Office: [phone]

## 2017-06-24 NOTE — Anesthesia Procedure Notes (Signed)
Procedure Name: Intubation Date/Time: 06/24/2017 6:18 PM Performed by: Lowry Bowl, CRNA Pre-anesthesia Checklist: Patient identified, Emergency Drugs available, Suction available and Patient being monitored Patient Re-evaluated:Patient Re-evaluated prior to induction Oxygen Delivery Method: Circle system utilized Preoxygenation: Pre-oxygenation with 100% oxygen Induction Type: IV induction, Cricoid Pressure applied and Rapid sequence Laryngoscope Size: Mac and 4 Grade View: Grade II Tube type: Oral Tube size: 7.5 mm Number of attempts: 1 Airway Equipment and Method: Stylet Placement Confirmation: ETT inserted through vocal cords under direct vision,  positive ETCO2 and breath sounds checked- equal and bilateral Secured at: 23 cm Tube secured with: Tape Dental Injury: Teeth and Oropharynx as per pre-operative assessment  Comments: Did not mask ventilate - RSI

## 2017-06-24 NOTE — Progress Notes (Signed)
CODE SEPSIS - PHARMACY COMMUNICATION  **Broad Spectrum Antibiotics should be administered within 1 hour of Sepsis diagnosis**  Time Code Sepsis Called/Page Received: 1707  Antibiotics Ordered: Zosyn  Time of 1st antibiotic administration:   Additional action taken by pharmacy: Patient to OR for acute non-perforated appendicitis before antibiotic could be administered  If necessary, Name of Provider/Nurse Contacted:     Valentina Guhristy, Gayatri Teasdale D ,PharmD Clinical Pharmacist  06/24/2017  6:27 PM

## 2017-06-24 NOTE — Anesthesia Post-op Follow-up Note (Signed)
Anesthesia QCDR form completed.        

## 2017-06-25 LAB — CBC
HCT: 34.1 % — ABNORMAL LOW (ref 40.0–52.0)
Hemoglobin: 11.4 g/dL — ABNORMAL LOW (ref 13.0–18.0)
MCH: 28.4 pg (ref 26.0–34.0)
MCHC: 33.5 g/dL (ref 32.0–36.0)
MCV: 84.9 fL (ref 80.0–100.0)
PLATELETS: 273 10*3/uL (ref 150–440)
RBC: 4.01 MIL/uL — ABNORMAL LOW (ref 4.40–5.90)
RDW: 14.9 % — ABNORMAL HIGH (ref 11.5–14.5)
WBC: 22 10*3/uL — AB (ref 3.8–10.6)

## 2017-06-25 MED ORDER — PANTOPRAZOLE SODIUM 40 MG PO TBEC
40.0000 mg | DELAYED_RELEASE_TABLET | Freq: Every day | ORAL | Status: DC
  Administered 2017-06-25 – 2017-06-26 (×2): 40 mg via ORAL
  Filled 2017-06-25 (×2): qty 1

## 2017-06-25 NOTE — Progress Notes (Addendum)
SURGICAL PROGRESS NOTE  Hospital Day(s): 0.   Post op day(s): 1 Day Post-Op.   Interval History: Patient seen and examined, no acute events or new complaints overnight. Patient reports he feels "much better" with only mild abdominal pain, tolerating clear liquids diet with +flatus without N/V, fever/chills, CP, or SOB.  Review of Systems:  Constitutional: denies fever, chills  Respiratory: denies any shortness of breath  Cardiovascular: denies chest pain or palpitations  Gastrointestinal: abdominal pain, N/V, and bowel function as per interval history Musculoskeletal: denies pain, decreased motor or sensation Integumentary: denies any other rashes or skin discolorations except post-surgical abdominal wounds  Vital signs in last 24 hours: [min-max] current  Temp:  [97.3 F (36.3 C)-99.8 F (37.7 C)] 98.1 F (36.7 C) (04/27 0823) Pulse Rate:  [100-132] 100 (04/27 0823) Resp:  [17-20] 20 (04/27 0823) BP: (103-155)/(67-90) 105/67 (04/27 0823) SpO2:  [93 %-98 %] 98 % (04/27 0823) Weight:  [160 lb (72.6 kg)-171 lb (77.6 kg)] 171 lb (77.6 kg) (04/26 2141)     Height:  (162.6 cm) Weight: 171 lb (77.6 kg) BMI (Calculated): 29.34   Intake/Output this shift:  Total I/O In: 469.7 [I.V.:469.7] Out: -    Intake/Output last 2 shifts:  @   Physical Exam:  Constitutional: alert, cooperative and no distress  Respiratory: breathing non-labored at rest  Cardiovascular: regular rate and sinus rhythm  Gastrointestinal: abdomen soft and non-distended with minimal tenderness to deep palpation, incisions well-approximated without surrounding erythema or drainage  Labs:  CBC Latest Ref Rng & Units 06/25/2017 06/24/2017  WBC 3.8 - 10.6 K/uL 22.0(H) 25.6(H)  Hemoglobin 13.0 - 18.0 g/dL 11.4(L) 15.0  Hematocrit 40.0 - 52.0 % 34.1(L) 44.6  Platelets 150 - 440 K/uL 273 302   CMP Latest Ref Rng & Units 06/24/2017  Glucose 65 - 99 mg/dL 161(W)  BUN 6 - 20 mg/dL 10  Creatinine  9.60 - 1.24 mg/dL 4.54  Sodium 098 - 119 mmol/L 133(L)  Potassium 3.5 - 5.1 mmol/L 4.4  Chloride 101 - 111 mmol/L 101  CO2 22 - 32 mmol/L 23  Calcium 8.9 - 10.3 mg/dL 9.9  Total Protein 6.5 - 8.1 g/dL 1.4(N)  Total Bilirubin 0.3 - 1.2 mg/dL 2.0(H)  Alkaline Phos 38 - 126 U/L 83  AST 15 - 41 U/L 25  ALT 17 - 63 U/L 20   Imaging studies: No new pertinent imaging studies   Assessment/Plan: (ICD-10's: K35.31) 62 y.o. male with decreasing, but still significantly elevated leukocytosis, otherwise doing well, 1 Day Post-Op s/p laparoscopic appendectomy for acute gangrenous non-perforated appendicitis with localized peritonitis and no appreciable abscess, complicated by sepsis on presentation with fever/tachycardia/leukocytosis and by pertinent comorbidities including HTN and asthma, no prior screening colonoscopy (12 years overdue, no family history of colon cancer).   - pain control prn (minimize narcotics)  - continue to advance to regular diet as tolerated  - despite doing well s/p uncomplicated laparoscopic appendectomy for gangrenous appendicitis without perforation, will plan to hold off on discharge due to significant leukocytosis in context of severe appendiceal inflammation and dilation   - outpatient colonoscopy in ~6 weeks advised and discussed, patient says he is ammenable  - medical management of comorbidities (home medications ordered)  - trend am leukocytosis, follow up pending surgical pathology  - DVT prophylaxis, ambulation encouraged  All of the above findings and recommendations were discussed with the patient, patient's family, and the medical team, and all of patient's and family's questions were answered to their expressed satisfaction.  --  Marilynne Drivers. Rosana Hoes, MD, [place]: [place] [facility] - Partnering for exceptional care. Office: [phone]

## 2017-06-26 LAB — CBC
HEMATOCRIT: 29.3 % — AB (ref 40.0–52.0)
HEMOGLOBIN: 10 g/dL — AB (ref 13.0–18.0)
MCH: 29.1 pg (ref 26.0–34.0)
MCHC: 34.1 g/dL (ref 32.0–36.0)
MCV: 85.5 fL (ref 80.0–100.0)
Platelets: 252 10*3/uL (ref 150–440)
RBC: 3.42 MIL/uL — ABNORMAL LOW (ref 4.40–5.90)
RDW: 14.7 % — AB (ref 11.5–14.5)
WBC: 16.8 10*3/uL — AB (ref 3.8–10.6)

## 2017-06-26 MED ORDER — CIPROFLOXACIN HCL 500 MG PO TABS: 500.0000 mg | ORAL_TABLET | Freq: Two times a day (BID) | ORAL | 0 refills | Status: AC

## 2017-06-26 MED ORDER — METRONIDAZOLE 500 MG PO TABS: 500.0000 mg | ORAL_TABLET | Freq: Three times a day (TID) | ORAL | 0 refills | Status: AC

## 2017-06-26 MED ORDER — OXYCODONE-ACETAMINOPHEN 5-325 MG PO TABS: 1.0000 | ORAL_TABLET | ORAL | 0 refills | Status: DC | PRN

## 2017-06-26 NOTE — Progress Notes (Signed)
Patient discharge teaching given, including activity, diet, follow-up appoints, and medications. Patient verbalized understanding of all discharge instructions. IV access was d/c'd. Vitals are stable. Skin is intact except as charted in most recent assessments. Pt to be escorted out by volunteer, to be driven home by family.  Job Holtsclaw  

## 2017-06-26 NOTE — Discharge Instructions (Signed)
In addition to included general post-operative instructions for Laparoscopic Appendectomy,  Diet: Gradually resume home heart healthy diet.   Activity: No heavy lifting >20 pounds (children, pets, laundry, garbage) or strenuous activity until follow-up, but light activity and walking are encouraged. Do not drive or drink alcohol if taking narcotic pain medications.  Wound care: You may shower/get incision wet with soapy water tonight and pat dry (do not rub incisions), but no baths or submerging incision underwater until follow-up.   Medications: Resume all home medications and complete prescribed course of antibiotics even if feeling better/well. For mild to moderate pain: acetaminophen (Tylenol) or ibuprofen/naproxen (if no kidney disease). Combining Tylenol with alcohol can substantially increase your risk of causing liver disease. Narcotic pain medications, if prescribed, can be used for severe pain, though may cause nausea, constipation, and drowsiness. Do not combine Tylenol and Percocet (or similar) within a 6 hour period as Percocet (and similar) contain(s) Tylenol. If you do not need the narcotic pain medication, you do not need to fill the prescription.  Call office [phone]) at any time if any questions, worsening pain, fevers/chills, bleeding, drainage from incision site, or other concerns.

## 2017-06-27 ENCOUNTER — Encounter: Payer: Self-pay | Admitting: Surgery

## 2017-06-27 NOTE — Discharge Summary (Signed)
Physician Discharge Summary  Patient ID: Albert Simpson MRN: [id number] DOB/AGE: [birth year] 62 y.o.  Admit date: 06/24/2017 Discharge date: 06/27/2017  Admission Diagnoses:  Discharge Diagnoses:  Active Problems:   Acute appendicitis with localized peritonitis and gangrene, without abscess   Discharged Condition: good  Hospital Course: 62 y.o. male presented to [facility] ED for abdominal pain. Workup was found to be significant for CT imaging demonstrating acute appendicitis with leukocytosis to WBC 26. Informed consent was obtained and documented, and patient underwent uneventful laparoscopic appendectomy [name], 06/24/2017).  Post-operatively, patient's pain and WBC improved, and advancement of patient's diet and ambulation were well-tolerated. Patient remained inpatient for IV antibiotics 2 days after surgery until WBC decreased significantly. The remainder of patient's hospital course was unremarkable, and discharge planning was initiated accordingly with patient safely able to be discharged home with appropriate discharge instructions, antibiotics, pain control, and outpatient surgical follow-up after all of his and family's questions were answered to their expressed satisfaction.  Consults: None  Significant Diagnostic Studies: labs: WBC 16 (from 26) and radiology: CT scan: acute appendicitis with very distended appendix  Treatments: IV hydration, antibiotics: Zosyn and surgery: laparoscopic appendectomy [name], 06/24/2017)  Discharge Exam: Blood pressure 113/77, pulse (!) 109, temperature 98.3 F (36.8 C), temperature source Oral, resp. rate 18, height  (1.626 m), weight 171 lb (77.6 kg), SpO2 93 %. General appearance: alert, cooperative and no distress GI: abdomen soft and non-distended with mild peri-incisional tenderness to palpation with incisions well-approximated without surrounding erythema or drainage  Disposition:    Allergies as of 06/26/2017   No Known Allergies      Medication List    TAKE these medications   albuterol 108 (90 Base) MCG/ACT inhaler Commonly known as:  PROVENTIL HFA;VENTOLIN HFA Inhale 2 puffs into the lungs every 4 (four) hours as needed for wheezing or shortness of breath.   amLODipine 5 MG tablet Commonly known as:  NORVASC Take 5 mg by mouth daily.   ASMANEX 120 METERED DOSES 220 MCG/INH inhaler Generic drug:  mometasone Inhale 1 puff into the lungs daily.   ciprofloxacin 500 MG tablet Commonly known as:  CIPRO Take 1 tablet (500 mg total) by mouth 2 (two) times daily for 3 days.   COMBIVENT RESPIMAT 20-100 MCG/ACT Aers respimat Generic drug:  Ipratropium-Albuterol Inhale 1 puff into the lungs 4 (four) times daily as needed for wheezing or shortness of breath.   lisinopril 20 MG tablet Commonly known as:  PRINIVIL,ZESTRIL Take 20 mg by mouth daily.   metroNIDAZOLE 500 MG tablet Commonly known as:  FLAGYL Take 1 tablet (500 mg total) by mouth 3 (three) times daily for 3 days.   oxyCODONE-acetaminophen 5-325 MG tablet Commonly known as:  PERCOCET/ROXICET Take 1 tablet by mouth every 4 (four) hours as needed for severe pain.      Follow-up Information    [facility]. Schedule an appointment as soon as possible for a visit in 2 week(s).   Specialty:  General Surgery Contact information: [address] [place] [address] [phone]          Signed: Ancil Linsey 06/27/2017, 7:27 PM

## 2017-06-28 LAB — SURGICAL PATHOLOGY

## 2017-06-28 NOTE — Anesthesia Postprocedure Evaluation (Signed)
Anesthesia Post Note  Patient: Albert Simpson  Procedure(s) Performed: APPENDECTOMY LAPAROSCOPIC (N/A Abdomen)  Patient location during evaluation: PACU Anesthesia Type: General Level of consciousness: awake and alert Pain management: pain level controlled Vital Signs Assessment: post-procedure vital signs reviewed and stable Respiratory status: spontaneous breathing and respiratory function stable Cardiovascular status: stable Anesthetic complications: no     Last Vitals:  Vitals:   06/26/17 0531 06/26/17 0805  BP: 113/77   Pulse: 78 (!) 109  Resp: 20 18  Temp: 36.8 C   SpO2: 93% 93%    Last Pain:  Vitals:   06/26/17 1136  TempSrc:   PainSc: 0-No pain                 Manson Luckadoo K

## 2017-06-29 LAB — CULTURE, BLOOD (ROUTINE X 2)
CULTURE: NO GROWTH
Culture: NO GROWTH
Special Requests: ADEQUATE
Special Requests: ADEQUATE

## 2017-06-30 ENCOUNTER — Telehealth: Payer: Self-pay | Admitting: Surgery

## 2017-06-30 NOTE — Telephone Encounter (Signed)
Left a message for the patient to call the office we have received fmla paperwork to be filled out. Patient told he would need to pay a 20.00 fee for the paperwork.

## 2017-06-30 NOTE — Telephone Encounter (Signed)
Patients daughter called and made the payment for the patients paperwork to be filled out.

## 2017-07-06 DIAGNOSIS — J309 Allergic rhinitis, unspecified: Secondary | ICD-10-CM | POA: Insufficient documentation

## 2017-07-07 ENCOUNTER — Telehealth: Payer: Self-pay | Admitting: Surgery

## 2017-07-07 NOTE — Telephone Encounter (Signed)
Patients disability/fmla paperwork has been faxed to [place].

## 2017-07-12 ENCOUNTER — Encounter: Payer: Self-pay | Admitting: Surgery

## 2017-07-12 ENCOUNTER — Ambulatory Visit (INDEPENDENT_AMBULATORY_CARE_PROVIDER_SITE_OTHER): Payer: BC Managed Care – PPO | Admitting: Surgery

## 2017-07-12 VITALS — BP 149/92 | HR 103 | Temp 97.9°F | Wt 160.0 lb

## 2017-07-12 DIAGNOSIS — K3532 Acute appendicitis with perforation and localized peritonitis, without abscess: Secondary | ICD-10-CM

## 2017-07-12 DIAGNOSIS — Z4889 Encounter for other specified surgical aftercare: Secondary | ICD-10-CM

## 2017-07-12 NOTE — Patient Instructions (Signed)

## 2017-07-12 NOTE — Progress Notes (Signed)
Surgical Clinic Progress/Follow-up Note   HPI:  62 y.o. Male presents to clinic for post-op follow-up 2 weeks s/p laparoscopic appendectomy [name], 06/24/2017). Patient reports complete resolution of pre-operative pain and has been tolerating regular diet with +flatus and normal BM's, denies N/V, fever/chills, CP, or SOB. He also says he completed the post-surgical antibiotics prescribed in context of gangrenous appendicitis with WBC of 26 upon presentation, though had much improved by time of discharge.  Completed his prescribed antibiotics, mild constipation with narcotic pain medication, been taking stool softener, large BM with large amount of +flatus this morning with relief from "gas pain"  Review of Systems:  Constitutional: denies fever/chills  Respiratory: denies shortness of breath, wheezing  Cardiovascular: denies chest pain, palpitations  Gastrointestinal: abdominal pain, N/V, and bowel function as per interval history Skin: Denies any other rashes or skin discolorations except post-surgical wounds as per interval history  Vital Signs:  BP (!) 149/92   Pulse (!) 103   Temp 97.9 F (36.6 C) (Oral)   Wt 160 lb (72.6 kg)   BMI 27.46 kg/m    Physical Exam:  Constitutional:  -- Normal body habitus  -- Awake, alert, and oriented x3  Pulmonary:  -- No crackles -- Equal breath sounds bilaterally -- Breathing non-labored at rest Cardiovascular:  -- S1, S2 present  -- No pericardial rubs  Gastrointestinal:  -- Soft, nontender, non-distended, no guarding/rebound -- Post-surgical incisions well-approximated without surrounding erythema or drainage -- No abdominal masses appreciated, pulsatile or otherwise  Musculoskeletal / Integumentary:  -- Wounds or skin discoloration: None appreciated except post-surgical wounds as described above (GI)  -- Extremities: B/L UE and LE FROM, hands and feet warm, no edema   Assessment:  62 y.o. yo Male with a problem list including...   Patient Active Problem List   Diagnosis Date Noted  . Chronic allergic rhinitis 07/06/2017  . Mild intermittent asthma with acute exacerbation 12/23/2016  . 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 Gout 09/19/2013  . HTN (hypertension) 04/12/2013    presents to clinic for post-op follow-up evaluation, doing well 2 weeks s/p laparoscopic appendectomy [name], 06/24/2017) for acute appendicitis with significant leukocytosis.  Plan:              - advance diet as tolerated              - okay to submerge incisions under water (baths, swimming) prn             - gradually resume all activities without restrictions over next 2 weeks             - apply sunblock particularly to incisions with sun exposure to reduce pigmentation of scars             - return to clinic as needed, instructed to call office if any questions or concerns  All of the above recommendations were discussed with the patient, and all of patient's questions were answered to his expressed satisfaction.  -- Scherrie Gerlach [name], MD, RPVI [place]: [facility] General Surgery - Partnering for exceptional care. Office: [phone]

## 2017-07-13 ENCOUNTER — Encounter: Payer: Self-pay | Admitting: Surgery

## 2017-07-27 ENCOUNTER — Telehealth: Payer: Self-pay | Admitting: General Practice

## 2017-07-27 NOTE — Telephone Encounter (Signed)
Called patient to let him know that what he had was completely normal. However, I asked him it he had any redness around the incision and if he had any fever or chills. He denied all of that. Therefore, I told him not to worry but if he started to have the above mentioned symptoms, then to please give [place] a call. Patient understood and had no further questions.

## 2017-07-27 NOTE — Telephone Encounter (Signed)
Patient called and left a message this morning patient said he had surgery done about two weeks ago, patient said he notice some yellowish skin around the belly button and was just concerned and asking if this was normal, patient said he feels great, no pain  please call patient and advise.

## 2019-03-24 IMAGING — CT CT ABD-PELV W/ CM
2 of 5 series · 15 of 46 positions shown, 17 images · IV contrast (APPLIED)
Comparison: None.

CLINICAL DATA: Pt comes into the ED via POV c/o generalized
abdominal pain. Denies any N/V/D. Patient states he took two zantac
today and it eased the pain a little, but the pain is still there.
The pain is offset by foods. Patient in NAd at this time with even
and unlabored respirations. Denies any chest pain, blood in stools,
shortness of breath, or dizziness.

EXAM:
CT ABDOMEN AND PELVIS WITH CONTRAST
TECHNIQUE: Multidetector CT imaging of the abdomen and pelvis was performed
using the standard protocol following bolus administration of
intravenous contrast.
CONTRAST:  75mL BY3HFX-VED IOPAMIDOL (BY3HFX-VED) INJECTION 76%

[Series 2: axial st · axial · 0.75mm/px · z∈[-449,-44]mm · 12 of 91 slices shown, 14 images]
[im 5/91  soft-tissue]
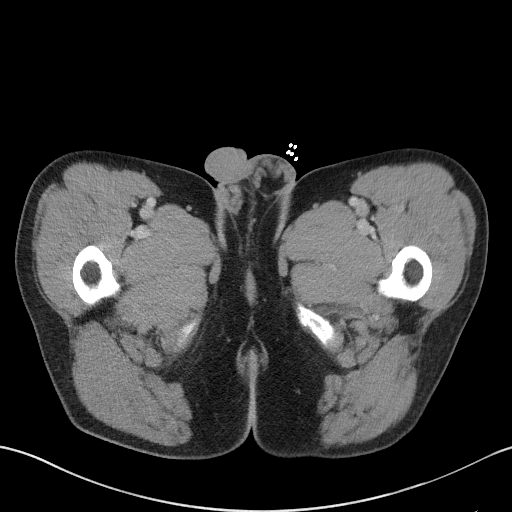
[im 5/91  bone]
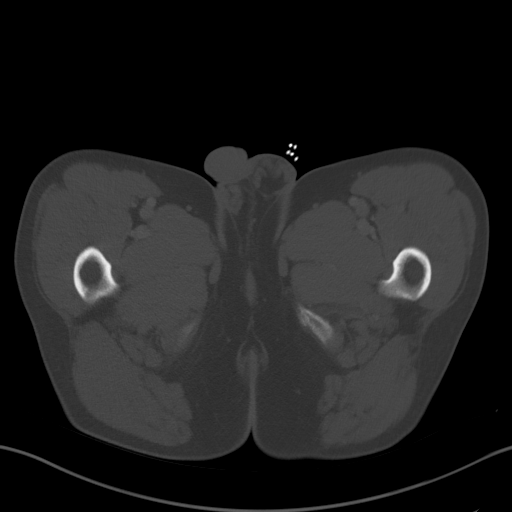
[im 14/91  soft-tissue]
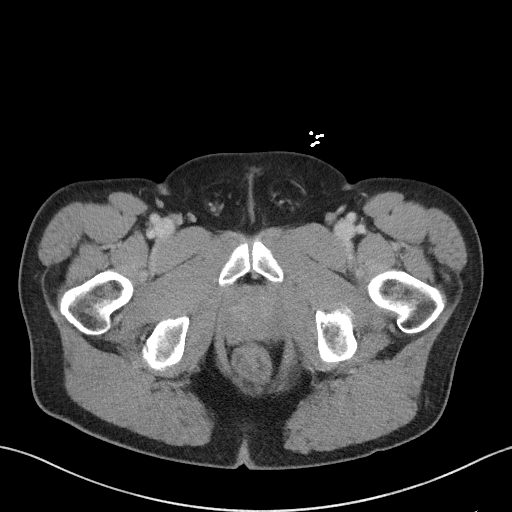
[im 19/91  soft-tissue]
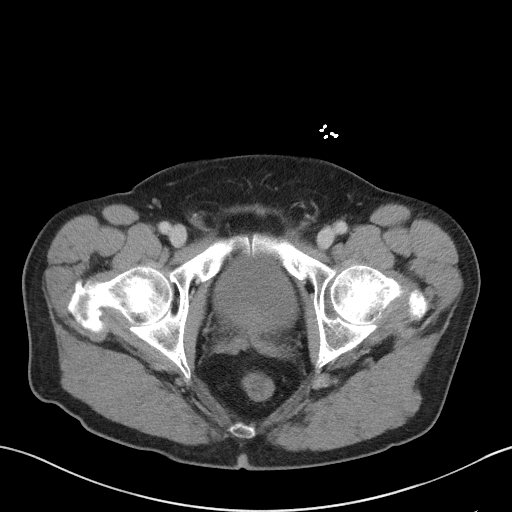
[im 28/91  soft-tissue]
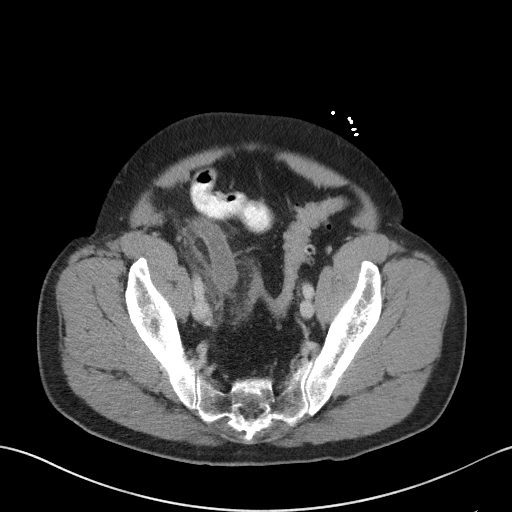
[im 37/91  soft-tissue]
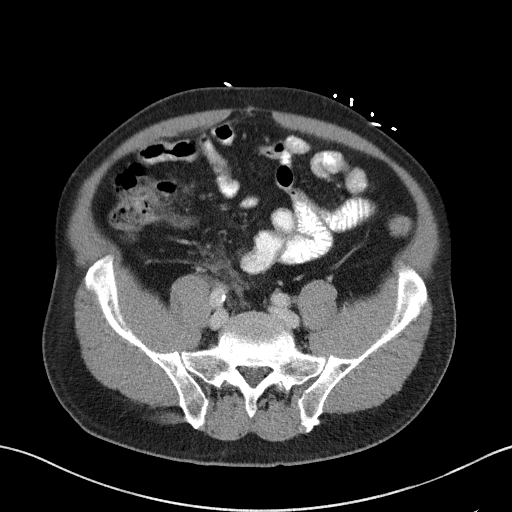
[im 41/91  soft-tissue]
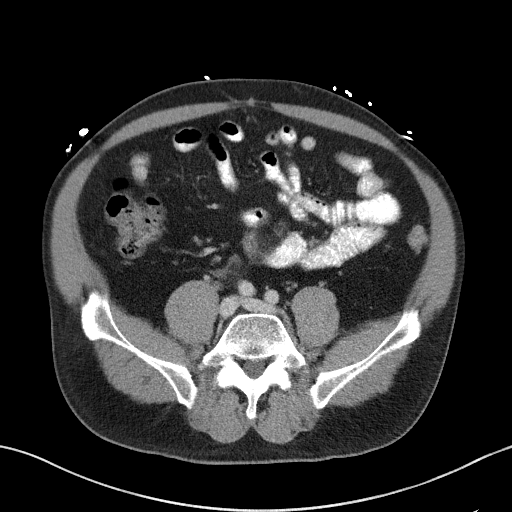
[im 50/91  soft-tissue]
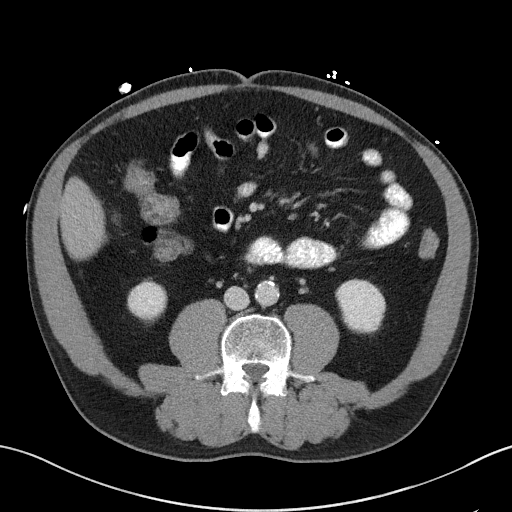
[im 55/91  soft-tissue]
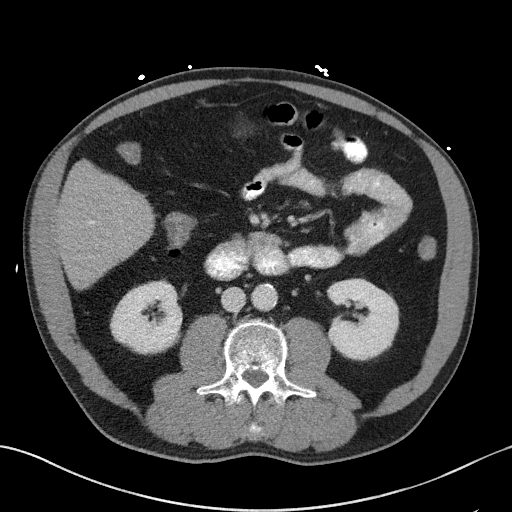
[im 64/91  soft-tissue]
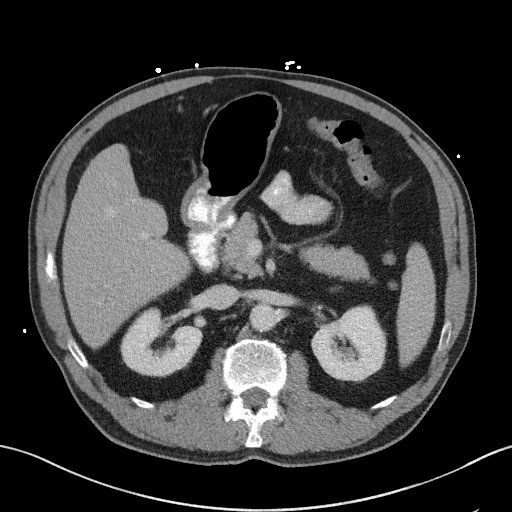
[im 64/91  bone]
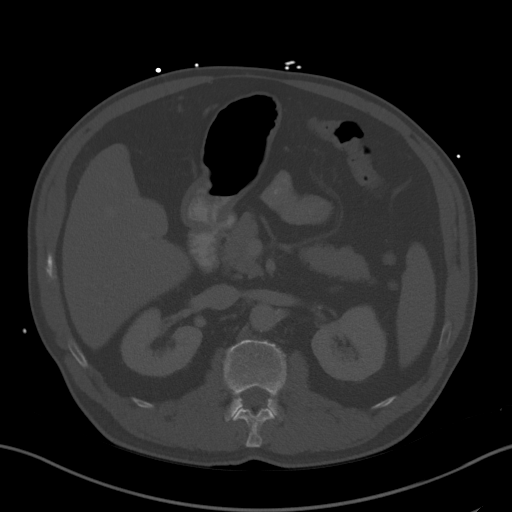
[im 73/91  soft-tissue]
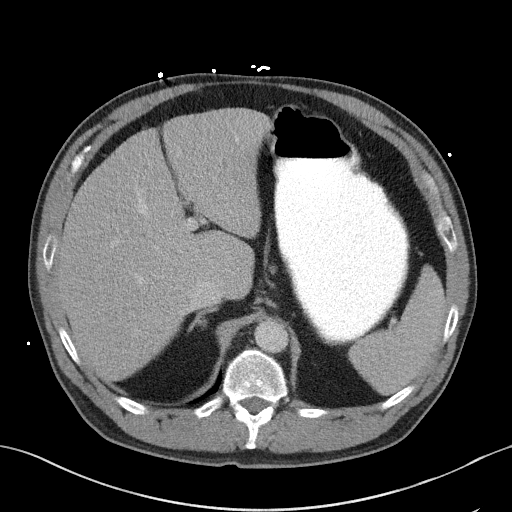
[im 77/91  soft-tissue]
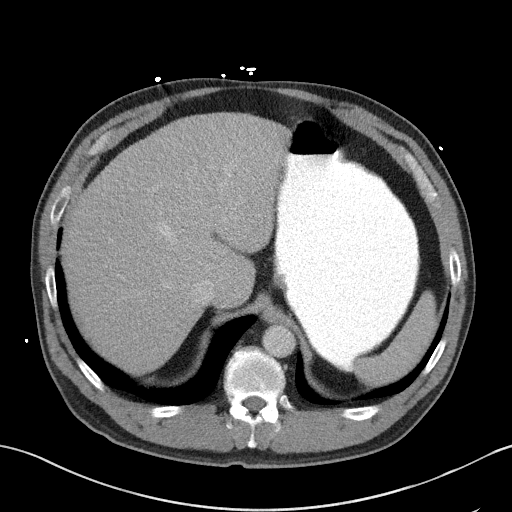
[im 86/91  soft-tissue]
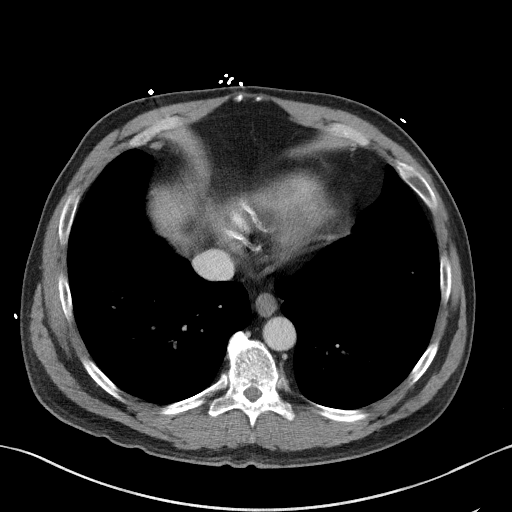

[Series 5: coronal st · coronal · 0.68mm/px · 3 of 99 slices shown]
[im 33/99  soft-tissue]
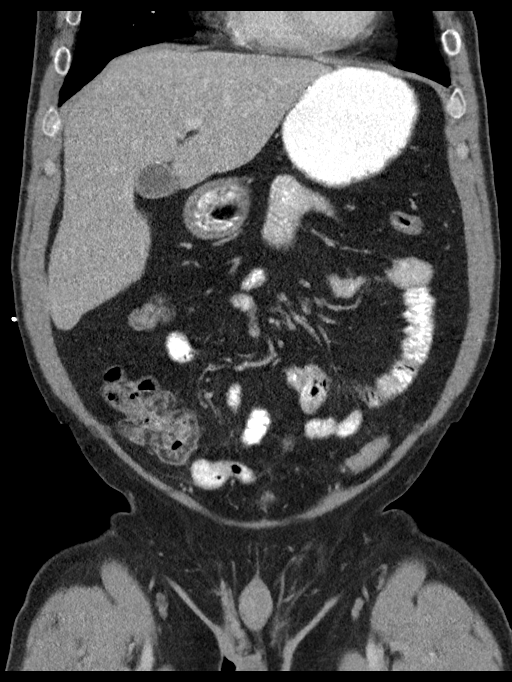
[im 44/99  soft-tissue]
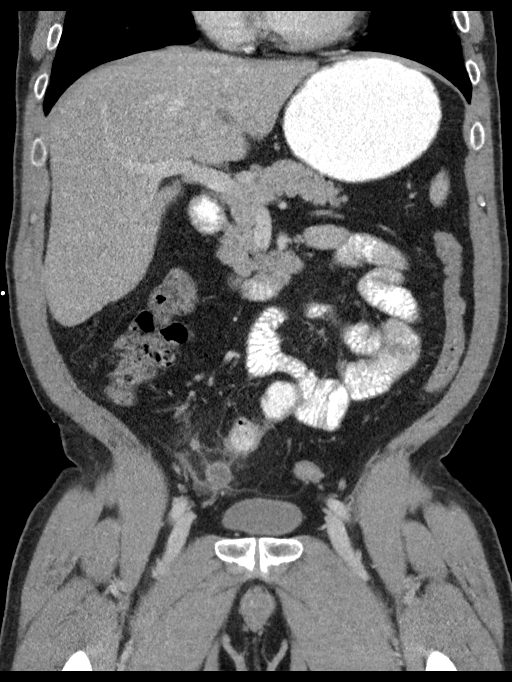
[im 55/99  soft-tissue]
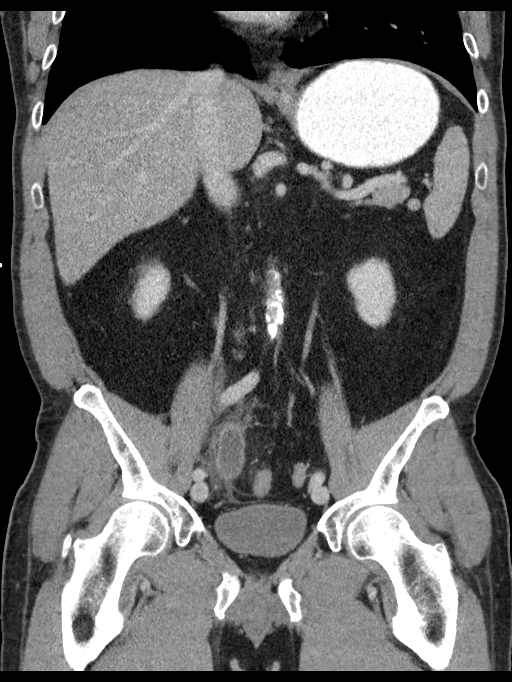

[15 of 46 positions shown; findings below may reference images not displayed]

FINDINGS: Lower chest: No acute findings. 5 mm ground-glass nodule in the left
lower lobe, image 2, series [DATE] mm subpleural nodule in the right
lower lobe, image 11.

Hepatobiliary: Liver is unremarkable. There are scattered
calcifications along the gallbladder wall, with no gallbladder wall
thickening. No evidence of stones. No inflammation. No bile duct
dilation.

Pancreas: Unremarkable. No pancreatic ductal dilatation or
surrounding inflammatory changes.

Spleen: Normal in size without focal abnormality.

Adrenals/Urinary Tract: Adrenal glands are unremarkable. Kidneys are
normal, without renal calculi, focal lesion, or hydronephrosis.
Bladder is unremarkable.

Stomach/Bowel: The appendix is significantly dilated, to a maximum
of 17 mm. There is periappendiceal inflammatory stranding. The
dilated, inflamed appendix lies within the right pelvis. There is no
extraluminal air. There is no formed fluid collection to suggest an
abscess.

Stomach and small bowel unremarkable. There scattered left colon
diverticula. No diverticulitis. No other colon abnormality.

Vascular/Lymphatic: Aortic atherosclerosis. No enlarged abdominal or
pelvic lymph nodes.

Reproductive: Unremarkable

Other: No ascites.  No hernia.

Musculoskeletal: No acute or significant osseous findings.
IMPRESSION: 1. Acute appendicitis. Appendix is significantly dilated to 1.7 cm
with prominent peri appendiceal inflammation, but no CT evidence of
rupture. No para appendiceal abscess.
2. No other acute abnormality.
3. Two small lung base nodules, largest on the left, ground-glass,
measuring 5 mm. No follow-up needed if patient is low-risk (and has
no known or suspected primary neoplasm). Non-contrast chest CT can
be considered in 12 months if patient is high-risk. This
recommendation follows the consensus statement: Guidelines for
Management of Incidental Pulmonary Nodules Detected on CT Images:

## 2023-03-15 ENCOUNTER — Encounter: Payer: Self-pay | Admitting: Emergency Medicine

## 2023-03-15 ENCOUNTER — Ambulatory Visit
Admission: EM | Admit: 2023-03-15 | Discharge: 2023-03-15 | Disposition: A | Discharge status: Home / Self Care | Payer: 59 | Attending: Emergency Medicine | Admitting: Emergency Medicine

## 2023-03-15 DIAGNOSIS — J029 Acute pharyngitis, unspecified: Secondary | ICD-10-CM | POA: Diagnosis not present

## 2023-03-15 DIAGNOSIS — K122 Cellulitis and abscess of mouth: Secondary | ICD-10-CM | POA: Insufficient documentation

## 2023-03-15 DIAGNOSIS — Z20822 Contact with and (suspected) exposure to covid-19: Secondary | ICD-10-CM | POA: Insufficient documentation

## 2023-03-15 LAB — SARS CORONAVIRUS 2 BY RT PCR: SARS Coronavirus 2 by RT PCR: NEGATIVE

## 2023-03-15 LAB — GROUP A STREP BY PCR: Group A Strep by PCR: NOT DETECTED

## 2023-03-15 MED ORDER — AEROCHAMBER MV MISC: 1 refills | Status: AC

## 2023-03-15 MED ORDER — PREDNISONE 20 MG PO TABS: 40.0000 mg | ORAL_TABLET | Freq: Every day | ORAL | 0 refills | Status: AC

## 2023-03-15 MED ORDER — IBUPROFEN 600 MG PO TABS: 600.0000 mg | ORAL_TABLET | Freq: Three times a day (TID) | ORAL | 0 refills | Status: AC | PRN

## 2023-03-15 MED ORDER — AMOXICILLIN-POT CLAVULANATE 875-125 MG PO TABS: 1.0000 | ORAL_TABLET | Freq: Two times a day (BID) | ORAL | 0 refills | Status: AC

## 2023-03-15 NOTE — ED Provider Notes (Signed)
 HPI  SUBJECTIVE:  Patient reports sore throat starting 3 days ago. Sx worse with swallowing.  Sx better with warm fluids, lozenges, Cepacol, Alka-Seltzer plus, Robitussin, albuterol , Tylenol /ibuprofen  and Flonase.  States he feels as if his uvula is swollen. No fever no neck stiffness  + Occasional mild productive cough, no wheezing, chest pain, shortness of breath + nasal congestion, clear rhinorrhea, postnasal drip No Myalgias No Headache No Rash   No shortness of breath  No nausea, vomiting No diarrhea No abdominal pain     No reflux sxs No Allergy sxs  No Breathing difficulty, voice changes, sensation of throat swelling shut No Drooling No Trismus No abx in past month. No antipyretic in past 6 hrs  Past medical history of hypertension, asthma, hypercholesterolemia PCP: [facility]   Past Medical History:  Diagnosis Date   Acute appendicitis with localized peritonitis and gangrene, without abscess 06/24/2017   Hypertension     Past Surgical History:  Procedure Laterality Date   LAPAROSCOPIC APPENDECTOMY N/A 06/24/2017   Procedure: APPENDECTOMY LAPAROSCOPIC;  Surgeon: Nicholaus Selinda Birmingham, MD;  Location: [facility] ORS;  Service: General;  Laterality: N/A;    History reviewed. No pertinent family history.  Social History   Tobacco Use   Smoking status: Never   Smokeless tobacco: Never  Substance Use Topics   Alcohol use: Yes    Comment: social   Drug use: Never    No current facility-administered medications for this encounter.  Current Outpatient Medications:    amoxicillin -clavulanate (AUGMENTIN ) 875-125 MG tablet, Take 1 tablet by mouth every 12 (twelve) hours for 10 days., Disp: 20 tablet, Rfl: 0   ibuprofen  (ADVIL ) 600 MG tablet, Take 1 tablet (600 mg total) by mouth every 8 (eight) hours as needed., Disp: 30 tablet, Rfl: 0   predniSONE  (DELTASONE ) 20 MG tablet, Take 2 tablets (40 mg total) by mouth daily with breakfast for 5 days., Disp: 10 tablet,  Rfl: 0   Spacer/Aero-Holding Chambers (AEROCHAMBER MV) inhaler, Use as instructed, Disp: 1 each, Rfl: 1   albuterol  (PROVENTIL  HFA;VENTOLIN  HFA) 108 (90 Base) MCG/ACT inhaler, Inhale 2 puffs into the lungs every 4 (four) hours as needed for wheezing or shortness of breath. , Disp: , Rfl:    amLODipine  (NORVASC ) 5 MG tablet, Take 5 mg by mouth daily., Disp: , Rfl: 6   ASMANEX 120 METERED DOSES 220 MCG/INH inhaler, Inhale 1 puff into the lungs daily., Disp: , Rfl: 11   atorvastatin (LIPITOR) 20 MG tablet, Take 20 mg by mouth daily., Disp: , Rfl: 11   COMBIVENT RESPIMAT 20-100 MCG/ACT AERS respimat, Inhale 1 puff into the lungs 4 (four) times daily as needed for wheezing or shortness of breath. , Disp: , Rfl: 99   lisinopril  (PRINIVIL ,ZESTRIL ) 20 MG tablet, Take 20 mg by mouth daily., Disp: , Rfl: 6   PAZEO 0.7 % SOLN, 1 drop 1 day or 1 dose., Disp: , Rfl: 99  No Known Allergies   ROS  As noted in HPI.   Physical Exam  BP 130/70 (BP Location: Left Arm)   Pulse 84   Temp 98.5 F (36.9 C)   Resp 18   SpO2 98%   Constitutional: Well developed, well nourished, no acute distress.  No muffled voice. Eyes:  EOMI, conjunctiva normal bilaterally HENT: Normocephalic, atraumatic,mucus membranes moist.  Nasal congestion.  Normal turbinates no maxillary, frontal sinus tenderness.  Erythematous oropharynx.  Erythematous, swollen uvula.  Tonsils normal size with exudates.  Uvula midline.  No drooling, trismus. Respiratory:  Normal inspiratory effort, lungs clear bilaterally Cardiovascular: Normal rate, no murmurs, rubs, gallops GI: nondistended skin: No rash, skin intact Lymph: Positive submandibular and anterior cervical LN.  No posterior cervical lymphadenopathy Musculoskeletal: no deformities Neurologic: Alert & oriented x 3, no focal neuro deficits Psychiatric: Speech and behavior appropriate.   ED Course   Medications - No data to display  Orders Placed This Encounter  Procedures    Group A Strep by PCR    Standing Status:   Standing    Number of Occurrences:   1   SARS Coronavirus 2 by RT PCR (hospital order, performed in [facility] hospital lab) *cepheid single result test* Anterior Nasal Swab    Standing Status:   Standing    Number of Occurrences:   1    Results for orders placed or performed during the hospital encounter of 03/15/23 (from the past 24 hours)  Group A Strep by PCR     Status: None   Collection Time: 03/15/23  8:59 AM   Specimen: Throat; Sterile Swab  Result Value Ref Range   Group A Strep by PCR NOT DETECTED NOT DETECTED  SARS Coronavirus 2 by RT PCR (hospital order, performed in [facility] hospital lab) *cepheid single result test* Anterior Nasal Swab     Status: None   Collection Time: 03/15/23  9:51 AM   Specimen: Anterior Nasal Swab  Result Value Ref Range   SARS Coronavirus 2 by RT PCR NEGATIVE NEGATIVE   No results found.  ED Clinical Impression  1. Uvulitis   2. Exudative pharyngitis   3. Lab test negative for COVID-19 virus      ED Assessment/Plan    Strep, COVID PCR negative.  However, patient has a uvulitis and exudative pharyngitis.  Will send home with Augmentin  for 10 days, Benadryl/Maalox, ibuprofen /Tylenol , saline nasal irrigation, continue Flonase.  Prednisone  40 mg for 5 days.  There is no evidence of epiglottitis or other deep space infection/emergency.  Doubt lisinopril  induced angioedema as he has other accompanying symptoms and exudative pharyngitis.  Will provide a spacer for his albuterol  inhaler.  Patient home with ibuprofen , Tylenol , Benadryl/Maalox mixture. Patient to followup with PCP when necessary.  ER return precautions given  Patient declined influenza testing.  Discussed labs,  MDM, plan and followup with patient. Discussed sn/sx that should prompt return to the ED. patient agrees with plan.   Meds ordered this encounter  Medications   amoxicillin -clavulanate (AUGMENTIN ) 875-125 MG tablet    Sig:  Take 1 tablet by mouth every 12 (twelve) hours for 10 days.    Dispense:  20 tablet    Refill:  0   predniSONE  (DELTASONE ) 20 MG tablet    Sig: Take 2 tablets (40 mg total) by mouth daily with breakfast for 5 days.    Dispense:  10 tablet    Refill:  0   ibuprofen  (ADVIL ) 600 MG tablet    Sig: Take 1 tablet (600 mg total) by mouth every 8 (eight) hours as needed.    Dispense:  30 tablet    Refill:  0   Spacer/Aero-Holding Chambers (AEROCHAMBER MV) inhaler    Sig: Use as instructed    Dispense:  1 each    Refill:  1     *This clinic note was created using Dragon dictation software. Therefore, there may be occasional mistakes despite careful proofreading.     Van Knee, MD 03/18/23 [phone]

## 2023-03-15 NOTE — ED Triage Notes (Signed)
Patient c/o nasal congestion and sore throat x 3 days

## 2023-03-15 NOTE — Discharge Instructions (Addendum)
 Your COVID and strep PCR negative, but I believe you have an infection that requires antibiotics.  Finish the Augmentin , even if you feel better.  The steroid should help with the swelling as well.  1 gram of Tylenol  and 600 mg ibuprofen  together 3-4 times a day as needed for pain.  Make sure you drink plenty of extra fluids.  Some people find salt water gargles and  Traditional Medicinal's Throat Coat tea helpful. Take 5 mL of liquid Benadryl and 5 mL of Maalox. Mix it together, and then hold it in your mouth for as long as you can and then swallow. You may do this 4 times a day.  Honey and lemon dissolved in hot water can also be soothing.  Continue Flonase, start saline nasal irrigation with a NeilMed sinus rinse and distilled water as often as you want.  If the spacer is too expensive at the pharmacy, you can get an AeroChamber Z stat spacer off of [employer] for $10-$15.  This will make your albuterol  inhaler much, much more effective.  Go to www.goodrx.com  or www.costplusdrugs.com to look up your medications. This will give you a list of where you can find your prescriptions at the most affordable prices. Or ask the pharmacist what the cash price is, or if they have any other discount programs available to help make your medication more affordable. This can be less expensive than what you would pay with insurance.

## 2024-03-06 ENCOUNTER — Ambulatory Visit
Admission: EM | Admit: 2024-03-06 | Discharge: 2024-03-06 | Disposition: A | Discharge status: Home / Self Care | Attending: Emergency Medicine | Admitting: Emergency Medicine

## 2024-03-06 ENCOUNTER — Ambulatory Visit

## 2024-03-06 DIAGNOSIS — R051 Acute cough: Secondary | ICD-10-CM

## 2024-03-06 DIAGNOSIS — J40 Bronchitis, not specified as acute or chronic: Secondary | ICD-10-CM | POA: Diagnosis not present

## 2024-03-06 MED ORDER — BENZONATATE 100 MG PO CAPS: 200.0000 mg | ORAL_CAPSULE | Freq: Three times a day (TID) | ORAL | 0 refills | Status: AC

## 2024-03-06 MED ORDER — DOXYCYCLINE HYCLATE 100 MG PO CAPS: 100.0000 mg | ORAL_CAPSULE | Freq: Two times a day (BID) | ORAL | 0 refills | Status: AC

## 2024-03-06 MED ORDER — PROMETHAZINE-DM 6.25-15 MG/5ML PO SYRP: 5.0000 mL | ORAL_SOLUTION | Freq: Four times a day (QID) | ORAL | 0 refills | Status: AC | PRN

## 2024-03-06 MED ORDER — ALBUTEROL SULFATE HFA 108 (90 BASE) MCG/ACT IN AERS: 2.0000 | INHALATION_SPRAY | RESPIRATORY_TRACT | 0 refills | Status: AC | PRN

## 2024-03-06 NOTE — Discharge Instructions (Addendum)
 Your chest x-ray did not show any evidence of pneumonia.  Your lung sounds suggest bronchitis.  Take the doxycycline  twice daily with food for 7 days for treatment of bronchitis.  Use the albuterol  inhaler, 1 to 2 puffs every 4-6 hours as needed for shortness of breath or wheezing.  Use the Tessalon  Perles every 8 hours during the day as needed for cough.  Take the Promethazine  DM cough syrup at bedtime as needed for cough and congestion.  You may continue to use over-the-counter Tylenol  and/or ibuprofen  according to the package instructions as needed for fever.  If you develop any new or worsening symptoms please return for reevaluation or see your primary care provider.

## 2024-03-06 NOTE — ED Provider Notes (Addendum)
 " [facility]    CSN: [id number] Arrival date & time: 03/06/24  1600      History   Chief Complaint Chief Complaint  Patient presents with   Cough    HPI [name] is a 69 y.o. male.   HPI  69 year old male with past medical history significant for hypertension, mild intermittent asthma, gout, and chronic allergic rhinitis presents for evaluation of a productive cough, wheezing, and fever for the past week.  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 Authorizing Provider  amLODipine  (NORVASC ) 5 MG tablet Take 5 mg by mouth daily. 05/29/17  Yes [provider]  ASMANEX 120 METERED DOSES 220 MCG/INH inhaler Inhale 1 puff into the lungs daily. 06/03/17  Yes [provider]  atorvastatin (LIPITOR) 20 MG tablet Take 20 mg by mouth daily. 06/30/17  Yes [provider]  benzonatate  (TESSALON ) 100 MG capsule Take 2 capsules (200 mg total) by mouth every 8 (eight) hours. 03/06/24  Yes Bernardino Ditch, NP  doxycycline  (VIBRAMYCIN ) 100 MG capsule Take 1 capsule (100 mg total) by mouth 2 (two) times daily for 7 days. 03/06/24 03/13/24 Yes Bernardino Ditch, NP  lisinopril  (PRINIVIL ,ZESTRIL ) 20 MG tablet Take 20 mg by mouth daily. 05/29/17  Yes [provider]   promethazine -dextromethorphan (PROMETHAZINE -DM) 6.25-15 MG/5ML syrup Take 5 mLs by mouth 4 (four) times daily as needed. 03/06/24  Yes Bernardino Ditch, NP  albuterol  (VENTOLIN  HFA) 108 (90 Base) MCG/ACT inhaler Inhale 2 puffs into the lungs every 4 (four) hours as needed for wheezing or shortness of breath. 03/06/24   Bernardino Ditch, NP  COMBIVENT RESPIMAT 20-100 MCG/ACT AERS respimat Inhale 1 puff into the lungs 4 (four) times daily as needed for wheezing or shortness of breath.  05/30/17   [provider]  ibuprofen  (ADVIL ) 600 MG tablet Take 1 tablet (600 mg total) by mouth every 8 (eight) hours as needed. 03/15/23   Mortenson, Ashley, MD  PAZEO 0.7 % SOLN 1 drop 1 day or 1 dose. 06/28/17   [provider]  Spacer/Aero-Holding Chambers (AEROCHAMBER MV) inhaler Use as instructed 03/15/23   Mortenson, Ashley, MD    Family History History reviewed. No pertinent family history.  Social History Social History[1]   Allergies   Patient has no known allergies.   Review of Systems Review of Systems  Constitutional:  Positive for fever.  Respiratory:  Positive for cough and wheezing.      Physical Exam Triage Vital Signs ED Triage Vitals  Encounter Vitals Group     BP      Girls Systolic BP Percentile      Girls Diastolic BP Percentile      Boys Systolic BP Percentile      Boys Diastolic BP Percentile      Pulse  Resp      Temp      Temp src      SpO2      Weight      Height      Head Circumference      Peak Flow      Pain Score      Pain Loc      Pain Education      Exclude from Growth Chart    No data found.  Updated Vital Signs BP 139/80 (BP Location: Right Arm)   Pulse (!) 107   Temp 99.4 F (37.4 C) (Oral)   Resp 18   Wt 168 lb (76.2 kg)   SpO2 94%   BMI 28.84 kg/m   Visual Acuity Right Eye Distance:   Left Eye Distance:   Bilateral Distance:    Right Eye Near:   Left Eye Near:    Bilateral Near:     Physical Exam Vitals and nursing note  reviewed.  Constitutional:      Appearance: Normal appearance. He is not ill-appearing.  HENT:     Head: Normocephalic and atraumatic.  Cardiovascular:     Rate and Rhythm: Normal rate and regular rhythm.     Pulses: Normal pulses.     Heart sounds: Normal heart sounds. No murmur heard.    No friction rub. No gallop.  Pulmonary:     Effort: Pulmonary effort is normal.     Breath sounds: Wheezing and rhonchi present.     Comments: Diffuse wheezes and rhonchi. Skin:    General: Skin is warm and dry.     Capillary Refill: Capillary refill takes less than 2 seconds.     Findings: No rash.  Neurological:     Mental Status: He is alert.      UC Treatments / Results  Labs (all labs ordered are listed, but only abnormal results are displayed) Labs Reviewed - No data to display  EKG   Radiology No results found.  Procedures Procedures (including critical care time)  Medications Ordered in UC Medications - No data to display  Initial Impression / Assessment and Plan / UC Course  I have reviewed the triage vital signs and the nursing notes.  Pertinent labs & imaging results that were available during my care of the patient were reviewed by me and considered in my medical decision making (see chart for details).   Patient is a nontoxic-appearing 69 year old male presenting for evaluation of lower respiratory complaints as outlined in HPI above.  In the exam room, he is able to speak in full sentence without dyspnea or tachypnea.  He reports that he had a fever about a week ago but started taking Tylenol  and ibuprofen  and has not had a fever since.  He currently has an elevated temp of 99.4 here in clinic.  He is able to speak in full sentence without dyspnea or tachypnea.  Cardiopulmonary exam reveals diffuse wheezes and rhonchi.  I suspect that the patient has bronchitis based upon his lung sounds but I will obtain a chest x-ray to evaluate for any acute cardiopulmonary  pathology.  Chest x-ray dependent reviewed and evaluated by me.  Impression: Lung fields are well aerated without evidence of infiltrate or effusion.  Cardiomediastinal silhouette at the upper limits of normal.  Radiology overread is pending. Radiology impression states no active cardiopulmonary disease.  I will discharge patient on the diagnosis of bronchitis and start him on doxycycline  100 mg twice daily for 7 days.  Prescribed albuterol  inhaler and spacer and he can do 1 or 2 puffs every 4-6 hours as needed for shortness breath or wheezing.  Tessalon  Perles for cough and a day and Promethazine  DM cough syrup for cough at night.   Final Clinical Impressions(s) / UC Diagnoses   Final diagnoses:  Acute cough  Bronchitis     Discharge Instructions      Your chest x-ray did not show any evidence of pneumonia.  Your lung sounds suggest bronchitis.  Take the doxycycline  twice daily with food for 7 days for treatment of bronchitis.  Use the albuterol  inhaler, 1 to 2 puffs every 4-6 hours as needed for shortness of breath or wheezing.  Use the Tessalon  Perles every 8 hours during the day as needed for cough.  Take the Promethazine  DM cough syrup at bedtime as needed for cough and congestion.  You may continue to use over-the-counter Tylenol  and/or ibuprofen  according to the package instructions as needed for fever.  If you develop any new or worsening symptoms please return for reevaluation or see your primary care provider.     ED Prescriptions     Medication Sig Dispense Auth. Provider   albuterol  (VENTOLIN  HFA) 108 (90 Base) MCG/ACT inhaler Inhale 2 puffs into the lungs every 4 (four) hours as needed for wheezing or shortness of breath. 18 g Bernardino Ditch, NP   benzonatate  (TESSALON ) 100 MG capsule Take 2 capsules (200 mg total) by mouth every 8 (eight) hours. 21 capsule Bernardino Ditch, NP   doxycycline  (VIBRAMYCIN ) 100 MG capsule Take 1 capsule (100 mg total) by mouth 2 (two)  times daily for 7 days. 14 capsule Bernardino Ditch, NP   promethazine -dextromethorphan (PROMETHAZINE -DM) 6.25-15 MG/5ML syrup Take 5 mLs by mouth 4 (four) times daily as needed. 118 mL Bernardino Ditch, NP      PDMP not reviewed this encounter.    Bernardino Ditch, NP 03/06/24 1706     [1]  Social History Tobacco Use   Smoking status: Never   Smokeless tobacco: Never  Substance Use Topics   Alcohol use: Yes    Comment: social   Drug use: Never     Bernardino Ditch, NP 03/06/24 1808  "

## 2024-03-06 NOTE — ED Triage Notes (Signed)
 Patient states that he's had a productive cough x 1 week with green brownish mucus. No fever

## 2024-03-07 ENCOUNTER — Ambulatory Visit: Payer: Self-pay
